# Patient Record
Sex: Male | Born: 1935 | Race: White | Hispanic: No | Marital: Married | Smoking: Current every day smoker
Health system: Southern US, Community
[De-identification: ages and names within clinical notes are randomized; demographics above are authoritative.]

## PROBLEM LIST (undated history)

## (undated) DIAGNOSIS — M1990 Unspecified osteoarthritis, unspecified site: Secondary | ICD-10-CM

## (undated) DIAGNOSIS — I714 Abdominal aortic aneurysm, without rupture, unspecified: Secondary | ICD-10-CM

## (undated) DIAGNOSIS — J449 Chronic obstructive pulmonary disease, unspecified: Secondary | ICD-10-CM

## (undated) DIAGNOSIS — I495 Sick sinus syndrome: Secondary | ICD-10-CM

## (undated) DIAGNOSIS — Z8719 Personal history of other diseases of the digestive system: Secondary | ICD-10-CM

## (undated) DIAGNOSIS — D649 Anemia, unspecified: Secondary | ICD-10-CM

## (undated) DIAGNOSIS — F32A Depression, unspecified: Secondary | ICD-10-CM

## (undated) DIAGNOSIS — I4891 Unspecified atrial fibrillation: Secondary | ICD-10-CM

## (undated) DIAGNOSIS — J310 Chronic rhinitis: Secondary | ICD-10-CM

## (undated) DIAGNOSIS — I2510 Atherosclerotic heart disease of native coronary artery without angina pectoris: Secondary | ICD-10-CM

## (undated) DIAGNOSIS — I10 Essential (primary) hypertension: Secondary | ICD-10-CM

## (undated) DIAGNOSIS — N189 Chronic kidney disease, unspecified: Secondary | ICD-10-CM

## (undated) DIAGNOSIS — I255 Ischemic cardiomyopathy: Secondary | ICD-10-CM

## (undated) DIAGNOSIS — I219 Acute myocardial infarction, unspecified: Secondary | ICD-10-CM

## (undated) DIAGNOSIS — J4489 Other specified chronic obstructive pulmonary disease: Secondary | ICD-10-CM

## (undated) DIAGNOSIS — F329 Major depressive disorder, single episode, unspecified: Secondary | ICD-10-CM

## (undated) DIAGNOSIS — Z7901 Long term (current) use of anticoagulants: Secondary | ICD-10-CM

## (undated) DIAGNOSIS — J189 Pneumonia, unspecified organism: Secondary | ICD-10-CM

## (undated) HISTORY — DX: Chronic rhinitis: J31.0

## (undated) HISTORY — PX: PACEMAKER INSERTION: SHX728

## (undated) HISTORY — PX: TONSILLECTOMY: SUR1361

## (undated) HISTORY — DX: Atherosclerotic heart disease of native coronary artery without angina pectoris: I25.10

## (undated) HISTORY — PX: COCCYX REMOVAL: SHX600

## (undated) HISTORY — DX: Unspecified atrial fibrillation: I48.91

## (undated) HISTORY — DX: Unspecified osteoarthritis, unspecified site: M19.90

## (undated) HISTORY — DX: Other specified chronic obstructive pulmonary disease: J44.89

## (undated) HISTORY — DX: Chronic obstructive pulmonary disease, unspecified: J44.9

---

## 1998-06-15 ENCOUNTER — Observation Stay (HOSPITAL_COMMUNITY): Admission: EM | Admit: 1998-06-15 | Discharge: 1998-06-16 | Payer: Self-pay | Admitting: *Deleted

## 1999-02-21 ENCOUNTER — Inpatient Hospital Stay (HOSPITAL_COMMUNITY): Admission: EM | Admit: 1999-02-21 | Discharge: 1999-02-26 | Payer: Self-pay | Admitting: Emergency Medicine

## 1999-02-21 ENCOUNTER — Encounter: Payer: Self-pay | Admitting: Emergency Medicine

## 1999-02-24 ENCOUNTER — Encounter: Payer: Self-pay | Admitting: Cardiology

## 1999-03-12 ENCOUNTER — Ambulatory Visit (HOSPITAL_COMMUNITY): Admission: RE | Admit: 1999-03-12 | Discharge: 1999-03-12 | Payer: Self-pay | Admitting: Cardiovascular Disease

## 1999-11-18 ENCOUNTER — Ambulatory Visit (HOSPITAL_COMMUNITY): Admission: RE | Admit: 1999-11-18 | Discharge: 1999-11-18 | Payer: Self-pay | Admitting: Cardiovascular Disease

## 1999-11-18 ENCOUNTER — Encounter: Payer: Self-pay | Admitting: Cardiovascular Disease

## 2002-06-05 ENCOUNTER — Encounter: Admission: RE | Admit: 2002-06-05 | Discharge: 2002-06-05 | Payer: Self-pay | Admitting: Orthopaedic Surgery

## 2002-06-05 ENCOUNTER — Encounter: Payer: Self-pay | Admitting: Orthopaedic Surgery

## 2005-01-20 ENCOUNTER — Ambulatory Visit: Payer: Self-pay | Admitting: Internal Medicine

## 2005-07-19 ENCOUNTER — Ambulatory Visit: Payer: Self-pay | Admitting: Internal Medicine

## 2005-09-09 ENCOUNTER — Inpatient Hospital Stay (HOSPITAL_COMMUNITY): Admission: EM | Admit: 2005-09-09 | Discharge: 2005-09-22 | Payer: Self-pay | Admitting: Emergency Medicine

## 2005-10-18 ENCOUNTER — Ambulatory Visit (HOSPITAL_COMMUNITY): Admission: RE | Admit: 2005-10-18 | Discharge: 2005-10-18 | Payer: Self-pay | Admitting: Cardiovascular Disease

## 2006-02-06 ENCOUNTER — Ambulatory Visit: Payer: Self-pay | Admitting: Internal Medicine

## 2006-08-07 ENCOUNTER — Ambulatory Visit: Payer: Self-pay | Admitting: Internal Medicine

## 2007-02-05 ENCOUNTER — Ambulatory Visit: Payer: Self-pay | Admitting: Internal Medicine

## 2007-08-22 ENCOUNTER — Encounter: Payer: Self-pay | Admitting: Internal Medicine

## 2007-08-22 DIAGNOSIS — J310 Chronic rhinitis: Secondary | ICD-10-CM

## 2007-08-22 DIAGNOSIS — Z9861 Coronary angioplasty status: Secondary | ICD-10-CM

## 2007-08-22 DIAGNOSIS — M1990 Unspecified osteoarthritis, unspecified site: Secondary | ICD-10-CM | POA: Insufficient documentation

## 2007-08-22 DIAGNOSIS — I2510 Atherosclerotic heart disease of native coronary artery without angina pectoris: Secondary | ICD-10-CM | POA: Insufficient documentation

## 2007-08-22 DIAGNOSIS — J449 Chronic obstructive pulmonary disease, unspecified: Secondary | ICD-10-CM | POA: Insufficient documentation

## 2007-10-02 ENCOUNTER — Ambulatory Visit: Payer: Self-pay | Admitting: Internal Medicine

## 2008-01-18 ENCOUNTER — Encounter: Payer: Self-pay | Admitting: Internal Medicine

## 2008-03-31 ENCOUNTER — Ambulatory Visit: Payer: Self-pay | Admitting: Internal Medicine

## 2008-07-22 ENCOUNTER — Encounter: Payer: Self-pay | Admitting: Internal Medicine

## 2008-07-23 ENCOUNTER — Telehealth: Payer: Self-pay | Admitting: Internal Medicine

## 2008-07-28 ENCOUNTER — Ambulatory Visit: Payer: Self-pay | Admitting: Internal Medicine

## 2008-08-03 DIAGNOSIS — I4891 Unspecified atrial fibrillation: Secondary | ICD-10-CM

## 2008-09-12 ENCOUNTER — Ambulatory Visit (HOSPITAL_COMMUNITY): Admission: RE | Admit: 2008-09-12 | Discharge: 2008-09-12 | Payer: Self-pay | Admitting: Urology

## 2008-11-17 ENCOUNTER — Encounter: Payer: Self-pay | Admitting: Internal Medicine

## 2009-01-15 ENCOUNTER — Ambulatory Visit: Payer: Self-pay | Admitting: Internal Medicine

## 2009-07-16 ENCOUNTER — Ambulatory Visit: Payer: Self-pay | Admitting: Internal Medicine

## 2009-09-16 ENCOUNTER — Ambulatory Visit (HOSPITAL_COMMUNITY): Admission: RE | Admit: 2009-09-16 | Discharge: 2009-09-16 | Payer: Self-pay | Admitting: Cardiology

## 2009-09-22 ENCOUNTER — Encounter: Payer: Self-pay | Admitting: Internal Medicine

## 2009-10-01 ENCOUNTER — Encounter: Payer: Self-pay | Admitting: Internal Medicine

## 2010-01-12 ENCOUNTER — Encounter: Payer: Self-pay | Admitting: Internal Medicine

## 2010-01-19 ENCOUNTER — Ambulatory Visit: Payer: Self-pay | Admitting: Internal Medicine

## 2010-05-04 ENCOUNTER — Encounter: Payer: Self-pay | Admitting: Internal Medicine

## 2010-09-06 ENCOUNTER — Telehealth: Payer: Self-pay | Admitting: Internal Medicine

## 2010-09-29 ENCOUNTER — Encounter: Payer: Self-pay | Admitting: Internal Medicine

## 2010-10-11 ENCOUNTER — Ambulatory Visit
Admission: RE | Admit: 2010-10-11 | Discharge: 2010-10-11 | Payer: Self-pay | Source: Home / Self Care | Attending: Internal Medicine | Admitting: Internal Medicine

## 2010-10-13 DIAGNOSIS — F17200 Nicotine dependence, unspecified, uncomplicated: Secondary | ICD-10-CM | POA: Insufficient documentation

## 2010-10-19 ENCOUNTER — Telehealth (INDEPENDENT_AMBULATORY_CARE_PROVIDER_SITE_OTHER): Payer: Self-pay | Admitting: *Deleted

## 2010-11-04 NOTE — Letter (Signed)
Summary: [facility]  [facility]   Imported By: Lester Avon 01/19/2010 09:42:49  _____________________________________________________________________  External Attachment:    Type:   Image     Comment:   External Document

## 2010-11-04 NOTE — Letter (Signed)
Summary: [facility]  [facility]   Imported By: Lennie Odor 10/20/2010 14:54:43  _____________________________________________________________________  External Attachment:    Type:   Image     Comment:   External Document

## 2010-11-04 NOTE — Assessment & Plan Note (Signed)
Summary: rov 6 months ///kp   Primary Provider/Referring Provider:  Katrinka Blazing in [place]  CC:  follow up, c/o head and neck pain, 3/10, coughs up loose brown mucous, and x3 days.  History of Present Illness: 07/28/08- had bronchitis. Antibiotic affected his coumadin. Now has a new cold, minor sniffle, no fever or purulent. Finished prednisone taper yesterday. Got flu vax. Needs a minute to get his breath when he lies down, but sleeps one pillow. Denies edema, chest pain.  01/15/09- COPD, rhinitis, CAD Concerned about cost of Foradil. Has a nebulizer but says it burns his mouth. Foradil does help. Shows me verrucous lesion on right auricle he's been removing with "rocket fuel".  Says he had productive cough through winter, now resolved.  denies chest pain, fever, blood, nodes, edema  11-Aug-2009- COPD, rhinitis, CAD Feels very gradual worsening dyspnea with exertion over time. Dusty work out on tractor this week has increased his wheeze.C/O rhinorhea.  January 19, 2010- COPD, rhinitis, CAD Blames pollen for head cold over the last week. Low grade fever. Productive cough brown. Denies sinus pain. Tussive sore chest .Took otc last night, but can't remember the name.Marland Kitchen He saw Dr Alanda Amass recently and was given depo inj there for dyspnea with COPD.   Current Medications (verified): 1)  Zocor 20 Mg  Tabs (Simvastatin) .... Once Daily 2)  Toprol Xl 50 Mg  Tb24 (Metoprolol Succinate) .... Take 1 1/2 Once Daily 3)  Spiriva Handihaler 18 Mcg  Caps (Tiotropium Bromide Monohydrate) .... Inhale Contents of 1 Capsule Once A Day 4)  Coumadin 1 Mg Tabs (Warfarin Sodium) .... Take As Directed 5)  Zantac 150 Mg Tabs (Ranitidine Hcl) .... Take As Directed As Needed 6)  Furosemide 20 Mg Tabs (Furosemide) .... Take As Directed 7)  Adult Aspirin Low Strength 81 Mg Tbdp (Aspirin) .... Take 1 By Mouth Once Daily 8)  Foradil Aerolizer 12 Mcg  Caps (Formoterol Fumarate) .... Use One Capsule Twice Daily  As Directed. 9)  Proair Hfa 108 (90 Base) Mcg/act  Aers (Albuterol Sulfate) .... 2 Puffs, Four Times A Day Prn 10)  Crestor 5 Mg Tabs (Rosuvastatin Calcium) .... Tale 1 By Mouth Once Daily 11)  Allopurinol 300 Mg Tabs (Allopurinol) .... Take 1 By Mouth Once Daily 12)  Qc Chlor-Pheniramine 4 Mg Tabs (Chlorpheniramine Maleate) .... Take As Directed As Needed 13)  Digoxin 0.125 Mg Tabs (Digoxin) .... Take 1 By Mouth Once Daily 14)  Dulera 100-5 Mcg/act Aero (Mometasone Furo-Formoterol Fum) .... 2 Puffs and Rinse Twice Daily 15)  Ipratropium Bromide 0.06 % Soln (Ipratropium Bromide) .Marland Kitchen.. 1-2 Puffs Each Nostril Up To Three Times A Day As Needed Runny Nose  Allergies: 1)  ! Sulfa  Comments:  Nurse/Medical Assistant: The patient's medications and allergies were reviewed with the patient and were updated in the Medication and Allergy Lists.  Past History:  Past Medical History: Last updated: 07/28/2008 OSTEOARTHRITIS (ICD-715.90) CORONARY ARTERY DISEASE (ICD-414.00) RHINITIS (ICD-472.0) CHRONIC OBSTRUCTIVE PULMONARY DISEASE (ICD-496) Atrial Fibrillation- chronic-coumadin, VVIR pacemaker-Dr.Weintraub Gout- Dr. 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  The patient denies anorexia, fever, weight  loss, weight gain, vision loss, decreased hearing, hoarseness, chest pain, syncope, peripheral edema, hemoptysis, abdominal pain, and severe indigestion/heartburn.    Vital Signs:  Patient profile:   77 year old male Height:      67 inches Weight:      165.38 pounds BMI:     26.00 O2  Sat:      91 % on Room air Temp:     99.3 degrees F oral Pulse rate:   103 / minute BP sitting:   124 / 86  (left arm) Cuff size:   regular  O2 Sat at Rest %:  91 O2 Flow:  Room air CC: follow up, c/o head and neck pain, 3/10, coughs up loose brown mucous, x3 days Is Patient Diabetic? No Pain Assessment Patient in pain? yes      Intensity: 3 Onset of pain  back of head and neck Comments meds updated   Physical Exam  Additional Exam:  General: A/Ox3; pleasant and cooperative, NAD, unlabored SKIN: verrucous lesion riight ear NODES: no lymphadenopathy HEENT: [place]/AT, EOM- WNL, Conjuctivae- clear, PERRLA, TM-WNL, Nose- clear, Throat- clear and wnl NECK: Supple w/ fair ROM, JVD- none, normal carotid impulses w/o bruits Thyroid- CHEST: diminished, slow congested cough  HEART: nearly regular, faint, no m/g/r heard ABDOMEN HYQ:MVHQ, nl pulses. NEURO: Grossly intact to observation,       Impression & Recommendations:  Problem # 1:  CHRONIC OBSTRUCTIVE PULMONARY DISEASE (ICD-496) Acute bronchitis exacerbation. He has neb at home. We will give depo and Z pak. Dr Alanda Amass gave depomedrol a week ago.If he doesn't begin to dlear we will look harder.  Medications Added to Medication List This Visit: 1)  Zithromax Z-pak 250 Mg Tabs (Azithromycin) .... 2 today then one daily  Other Orders: Est. Patient Level III (46962) Prescription Created Electronically [phone]) Admin of Therapeutic Inj  intramuscular or subcutaneous (13244)  Patient Instructions: 1)  Please schedule a follow-up appointment in 6 months. 2)  depo 80- 3)  script for antibiotic sent to your drug store Prescriptions: ZITHROMAX Z-PAK 250 MG TABS (AZITHROMYCIN) 2 today then one daily  #1 pak x 0   Entered and Authorized by:   Waymon Budge MD   Signed by:   Waymon Budge MD on 01/19/2010   Method used:   Electronically to        [facility]* (retail)       [address]        [place], [place]  [address]       Ph: [phone]       Fax: [phone]   RxID:   [id number]    Immunization History:  Influenza Immunization History:    Influenza:  historical (07/09/2009)  Pneumovax Immunization History:    Pneumovax:  historical (07/10/2009)      Medication Administration  Injection # 1:    Medication: Depo- Medrol 80mg     Diagnosis: CHRONIC OBSTRUCTIVE PULMONARY DISEASE (ICD-496)    Route: IM    Site: LUOQ gluteus    Exp Date: 08/2012    Lot #: [id number]    Mfr: Pharmacia    Patient tolerated injection without complications    Given by: Randell Loop CMA (January 19, 2010 4:02 PM)  Orders Added: 1)  Est. Patient Level III [63016] 2)  Prescription Created Electronically [[id number]] 3)  Admin of Therapeutic Inj  intramuscular or subcutaneous [01093]

## 2010-11-04 NOTE — Assessment & Plan Note (Signed)
Summary: rov/jd   Primary Provider/Referring Provider:  Katrinka Blazing in [place]  CC:  follow up on COPD. pt states his breathing has been "okay". Pt c/o cough w/ clear phlem, chest tightness, and wheezing and some weakness.  History of Present Illness: 01/15/09- COPD, rhinitis, CAD Concerned about cost of Foradil. Has a nebulizer but says it burns his mouth. Foradil does help. Shows me verrucous lesion on right auricle he's been removing with "rocket fuel".  Says he had productive cough through winter, now resolved.  denies chest pain, fever, blood, nodes, edema  July 16, 2009- COPD, rhinitis, CAD Feels very gradual worsening dyspnea with exertion over time. Dusty work out on tractor this week has increased his wheeze.C/O rhinorhea.  January 19, 2010- COPD, rhinitis, CAD Blames pollen for head cold over the last week. Low grade fever. Productive cough brown. Denies sinus pain. Tussive sore chest .Took otc last night, but can't remember the name.Marland Kitchen He saw Dr Alanda Amass recently and was given depo inj there for dyspnea with COPD.  October 11, 2010- COPD, rhinitis, CAD Nurse-CC: follow up on COPD. pt states his breathing has been "okay". Pt c/o cough w/ clear phlegm, chest tightness, wheezing and some weakness. Says pollen bothered badly in early Fall. then some chest tightness after flu shot. Never completely cleared, still with some productive cough white to gray sputum. No  blood, chest pain, nodes or fever. Denies obvious infection. Still smokes 3 cigs/ day- discussed. Discussed his latest cardiology visit with Dr Alanda Amass. He didn't finish 10 day abx script of ?biaxin- from his PCP.    Preventive Screening-Counseling & Management  Alcohol-Tobacco     Smoking Status: current     Smoking Cessation Counseling: yes     Packs/Day: <0.25     Year Started: 77 years old     Tobacco Counseling: to quit use of tobacco products  Current Medications (verified): 1)  Toprol Xl 50 Mg  Tb24  (Metoprolol Succinate) .... Take 1 1/2 Once Daily 2)  Coumadin 2 Mg Tabs (Warfarin Sodium) .... As Directed 3)  Zantac 150 Mg Tabs (Ranitidine Hcl) .... Take As Directed As Needed 4)  Furosemide 20 Mg Tabs (Furosemide) .... Take As Directed 5)  Adult Aspirin Low Strength 81 Mg Tbdp (Aspirin) .... Take 1 By Mouth Once Daily 6)  Foradil Aerolizer 12 Mcg  Caps (Formoterol Fumarate) .... Use One Capsule Twice Daily As Directed. 7)  Proair Hfa 108 (90 Base) Mcg/act  Aers (Albuterol Sulfate) .... 2 Puffs, Four Times A Day Prn 8)  Crestor 10 Mg Tabs (Rosuvastatin Calcium) .... 1/2 Once Daily 9)  Qc Chlor-Pheniramine 4 Mg Tabs (Chlorpheniramine Maleate) .... Take As Directed As Needed 10)  Digoxin 0.125 Mg Tabs (Digoxin) .... Take 1 By Mouth Once Daily 11)  Ipratropium Bromide 0.06 % Soln (Ipratropium Bromide) .Marland Kitchen.. 1-2 Puffs Each Nostril Up To Three Times A Day As Needed Runny Nose 12)  Midodrine Hcl 2.5 Mg Tabs (Midodrine Hcl) .... Once Daily 13)  Colchicine-Probenecid 0.5-500 Mg Tabs (Colchicine-Probenecid) .... 60 Mg 1 Two Times A Day 14)  Metoprolol Tartrate 25 Mg Tabs (Metoprolol Tartrate) .Marland Kitchen.. 1 1/2 Once Daily 15)  Flomax 0.4 Mg Caps (Tamsulosin Hcl) .Marland Kitchen.. 1 Two Times A Day  Allergies (verified): 1)  ! Sulfa  Past History:  Past Medical History: Last updated: 07/28/2008 OSTEOARTHRITIS (ICD-715.90) CORONARY ARTERY DISEASE (ICD-414.00) RHINITIS (ICD-472.0) CHRONIC OBSTRUCTIVE PULMONARY DISEASE (ICD-496) Atrial Fibrillation- chronic-coumadin, VVIR pacemaker-Dr.Weintraub Gout- Dr. 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 3 cigs a day.   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 3 cigs a day.  Packs/Day:  <0.25  Review of Systems      See HPI       The patient complains of shortness of breath with activity, productive cough, and non-productive cough.  The patient denies shortness of breath at rest, coughing up blood, chest pain, irregular heartbeats, acid heartburn, indigestion, loss of appetite, weight change, abdominal pain, difficulty swallowing, sore throat, tooth/dental problems, headaches, nasal congestion/difficulty breathing through nose, and sneezing.    Vital Signs:  Patient profile:   77 year old male Height:      67 inches Weight:      158.50 pounds BMI:     24.91 O2 Sat:      92 % on Room air Pulse rate:   75 / minute BP sitting:   80 / 60  (right arm) Cuff size:   regular  Vitals Entered By: Carver Fila (October 11, 2010 2:33 PM)  O2 Flow:  Room air CC: follow up on COPD. pt states his breathing has been "okay". Pt c/o cough w/ clear phlem, chest tightness, wheezing and some weakness Comments meds and allergies updated Phone number updated Carver Fila  October 11, 2010 2:33 PM    Physical Exam  Additional Exam:  General: A/Ox3; pleasant and cooperative, NAD, unlabored SKIN: verrucous lesion riight ear NODES: no lymphadenopathy HEENT: [place]/AT, EOM- WNL, Conjuctivae- clear, PERRLA, TM-WNL, Nose- clear, Throat- clear and wnl NECK: Supple w/ fair ROM, JVD- none, normal carotid impulses w/o bruits Thyroid- CHEST: diminished, slow congested cough ,  bilateral crackles HEART: nearly regular, faint, no m/g/r heard ABDOMEN not obese ZOX:WRUE, nl pulses. NEURO: Grossly intact to observation,       Impression & Recommendations:  Problem # 1:  CHRONIC OBSTRUCTIVE PULMONARY DISEASE (ICD-496) Chronic bronchits with ongoing light smoking. He is resistant to adding more meds. We will advise CXR and mucinex. Smoking cessation.   Problem # 2:  TOBACCO USER  (ICD-305.1) He pushed for my agreement that probably a couple of cigarettes daily would have little medical significance. I pushed for him to quit completely with discussion of habit vs addiction.   Medications Added to Medication List This Visit: 1)  Coumadin 2 Mg Tabs (Warfarin sodium) .... As directed 2)  Crestor 10 Mg Tabs (Rosuvastatin calcium) .... 1/2 once daily 3)  Midodrine Hcl 2.5 Mg Tabs (Midodrine hcl) .... Once daily 4)  Colchicine-probenecid 0.5-500 Mg Tabs (Colchicine-probenecid) .... 60 mg 1 two times a day 5)  Metoprolol Tartrate 25 Mg Tabs (Metoprolol tartrate) .Marland Kitchen.. 1 1/2 once daily 6)  Flomax 0.4 Mg Caps (Tamsulosin hcl) .Marland Kitchen.. 1 two times a day  Other Orders: Est. Patient Level III (45409) Admin of Therapeutic Inj  intramuscular or subcutaneous (81191) Depo- Medrol 80mg  (J1040) Nebulizer Tx (47829) T-2 View CXR (71020TC)  Patient Instructions: 1)  Please schedule a follow-up appointment in 6 months. 2)  A chest x-ray has been recommended.  Your imaging study may require preauthorization.  3)  Neb xop 0.63 4)  depo 80  5)  Suggest mucinex otc as a mucus thinner if needed   Immunization History:  Influenza  Immunization History:    Influenza:  historical (07/03/2010)     Medication Administration  Injection # 1:    Medication: Depo- Medrol 80mg     Diagnosis: CHRONIC OBSTRUCTIVE PULMONARY DISEASE (ICD-496)    Route: IM    Site: LUOQ gluteus    Exp Date: 01/2013    Lot #: [id number]    Mfr: Pharmacia    Patient tolerated injection without complications    Given by: Carver Fila (October 11, 2010 4:11 PM)  Orders Added: 1)  Est. Patient Level III [60454] 2)  Admin of Therapeutic Inj  intramuscular or subcutaneous [96372] 3)  Depo- Medrol 80mg  [J1040] 4)  Nebulizer Tx [94640] 5)  T-2 View CXR [71020TC]

## 2010-11-04 NOTE — Letter (Signed)
Summary: [facility]   Imported By: Sherian Rein 05/17/2010 10:15:04  _____________________________________________________________________  External Attachment:    Type:   Image     Comment:   External Document

## 2010-11-04 NOTE — Progress Notes (Signed)
Summary: xray results  Phone Note Call from Patient Call back at Home Phone [phone]   Caller: Patient Call For: young Reason for Call: Lab or Test Results Summary of Call: Requests xray results. Initial call taken by: Darletta Moll,  October 19, 2010 11:22 AM  Follow-up for Phone Call        Spoke with pt and notified of results per CDY append to cxr.  Pt verbalized understanding. Follow-up by: Vernie Murders,  October 19, 2010 11:35 AM

## 2010-11-04 NOTE — Progress Notes (Signed)
Summary: nos appt  Phone Note Call from Patient   Caller: [email or web address]  Call For: young Summary of Call: Rsc nos from 12/2 to 10/11/2010. Initial call taken by: Darletta Moll,  September 06, 2010 12:01 PM

## 2011-01-04 LAB — URINALYSIS, ROUTINE W REFLEX MICROSCOPIC
Bilirubin Urine: NEGATIVE
Nitrite: NEGATIVE
Specific Gravity, Urine: 1.019 (ref 1.005–1.030)
Urobilinogen, UA: 1 mg/dL (ref 0.0–1.0)
pH: 6.5 (ref 5.0–8.0)

## 2011-01-04 LAB — URINE CULTURE

## 2011-01-04 LAB — URINE MICROSCOPIC-ADD ON

## 2011-02-15 NOTE — Op Note (Signed)
NAME:  Willie [name], Willie [name] NO.:  [id number]   MEDICAL RECORD NO.:  [id number]          PATIENT TYPE:  AMB   LOCATION:  DAY                          FACILITY:  [facility]   PHYSICIAN:  Maretta Bees. Vonita Moss, M.D.DATE OF BIRTH:  09-20-1928   DATE OF PROCEDURE:  09/12/2008  DATE OF DISCHARGE:                               OPERATIVE REPORT   PREOPERATIVE DIAGNOSIS:  Distal left ureteral stones.   POSTOPERATIVE DIAGNOSIS:  Distal left ureteral stones.   PROCEDURE:  Cystoscopy, left ureteroscopy and laser fragmentation and  basketing of stones.   SURGEON:  Larey Dresser, MD   ANESTHESIA:  General.   INDICATIONS:  A 77 year old gentleman with several weeks of pelvic  discomfort and frequency due to a 6-7 mm stone and one other tiny stone  in the distal left ureter.  He felt like he needed relief and therefore  he was brought to the OR today for that reason.   PROCEDURE IN DETAIL:  The patient was brought to the operating room and  placed in lithotomy position.  External genitalia were prepped and  draped in the usual fashion.  He was cystoscoped.  The anterior  prostatic urethra was unremarkable.  There was partial prostatic  obstruction.  The bladder had no stones, tumors or inflammatory lesions.  I then passed a retractable core guidewire under fluoroscopic control  and bypassed the stone in distal left ureter.  I then inserted the inner  core of a ureteral access sheath to dilate the intramural ureter which  was done easily.  I then used the short 6 French ureteroscope and found  the brownish irregular stone with two other tiny whitish stones and  using Holmium laser to fragment this large stone in several pieces.  I  then was easily able to basket out all the remaining stone fragments  which were later retrieved from the bladder cystoscopically.  I then ran  the scope up almost to the kidney and saw no further stones.  I then  injected contrast through the ureteroscope  and the ureter was intact  with no extravasation and prompt drainage and the ureter was visualized  and noted to be intact with no significant trauma whatsoever.  Therefore, I decided it was not necessary to place a double-J catheter  at this time.   I should add that his preoperative chest x-ray raised a question of a  left chest mass and I gave a copy of the chest x-ray report to his son  and wrote down written instructions for them to take this to their  medical doctor for further evaluation.      Maretta Bees. Vonita Moss, M.D.  Electronically Signed     LJP/MEDQ  D:  09/12/2008  T:  09/13/2008  Job:  [id number]   cc:   Gerlene Burdock A. Alanda Amass, M.D.  Fax: [phone]

## 2011-02-18 NOTE — Discharge Summary (Signed)
NAMEDORTHY, HUSTEAD NO.:  [id number]   MEDICAL RECORD NO.:  [id number]          PATIENT TYPE:  INP   LOCATION:  3742                         FACILITY:  [facility]   PHYSICIAN:  Nanetta Batty, M.D.   DATE OF BIRTH:  August 24, 1928   DATE OF ADMISSION:  09/09/2005  DATE OF DISCHARGE:                                 DISCHARGE SUMMARY   ADDENDUM:  At discharge, we held Mr. Affeldt Lasix and added Zocor 40 mg a  day.  He will need followup lipids as an outpatient.      Abelino Derrick, P.A.      Nanetta Batty, M.D.  Electronically Signed    LKK/MEDQ  D:  09/22/2005  T:  09/24/2005  Job:  [id number]

## 2011-02-18 NOTE — Discharge Summary (Signed)
[name], [name] NO.:  [id number]   MEDICAL RECORD NO.:  [id number]          PATIENT TYPE:  INP   LOCATION:  3742                         FACILITY:  [facility]   PHYSICIAN:  Willie [name], M.D.DATE OF BIRTH:  [birth year]   DATE OF ADMISSION:  09/09/2005  DATE OF DISCHARGE:                                 DISCHARGE SUMMARY   DISCHARGE DIAGNOSIS:  1.  MI this admission, plan is for medical therapy.  2.  Chronic atrial fibrillation.  3.  PT/PTT.  4.  Coumadin therapy.  5.  COPD.   HOSPITAL COURSE:  The patient is a 77 year old male followed by Dr.  Alanda [name] with a history of coronary disease. He had a pacemaker implanted  in 1998.  He has chronic AF. He had an L8 non DES stent in May 2000. He was  admitted 09/10/2003 with unstable angina. His INR was 1.7. Coumadin was  held. He was put on IV heparin. His troponins came back positive with a CK  of 1949 and 254 MB's.  He was also put on Integrilin on admission. He was  set up for diagnostic catheterization which was done 09/12/2005. This  revealed 80% proximal RCA and 95% mid RCA, it was a small vessel. Left main  had 20% narrowing and LAD had a patent stent site. The circumflex was  patent. The OM1 had a 50% narrowing, OM2 60% and OM3 60%. EF was 40-45% with  some inferior apical hypokinesis. The plan was for medical therapy.  Integrilin was discontinued. He was transferred to telemetry and Coumadin  was resumed. He did have some hypotension and we had to hold off on his ACE  inhibitor. Dr. Alanda [name] would like him to be considered for outpatient  E[facility]P.  He will discuss this further with him as an outpatient. His INR on  the 21st is 2.1. We feel he is stable for discharge.   DISCHARGE MEDICATIONS:  1.  Colchicine 0.6 milligrams a day.  2.  Zantac 150 once a day.  3.  Lanoxin 0.125 milligrams a day.  4.  Lexapro 10 milligrams a day.  5.  Vasotec 2.5 milligrams a day.  6.  M-Dur 15 milligrams a day.  7.   Aspirin 81 milligrams a day.  8.  Toprol XL 25 milligrams a day.   LABS:  Sodium 143, potassium 3.9, BUN 16, creatinine 1.3, white count 6.5,  hemoglobin 13.6, hematocrit 39, platelets 205.  Liver functions show an AST  of 80 on admission with an ALT of 26.  Urinalysis is unremarkable. TSH is  3.86.  CHEST X-RAY:  Shows no active disease with artifact along the left  lung peripheral chest wall and COPD.  His EKG is paced with afib underlying.   DISPOSITION:  The patient is discharged in stable condition and will follow-  up with Dr. Alanda [name] in a couple of weeks.  He will have a protime checked  next week. He will resume his home dose of Coumadin which is 2 milligrams a  day with 4 milligrams on Friday.      Willie [name],  P.A.      Willie [name], M.D.  Electronically Signed    LKK/MEDQ  D:  09/22/2005  T:  09/24/2005  Job:  [id number]   cc:   Willie [name], M.D.  Fax: [phone]

## 2011-02-18 NOTE — Assessment & Plan Note (Signed)
[facility]                             PULMONARY OFFICE NOTE   NAME:BENNETTOrestes, Geiman                      MRN:          [id number]  DATE:02/05/2007                            DOB:          1928-09-30    PROBLEMS:  1. Chronic obstructive pulmonary disease.  2. Rhinitis.  3. Coronary disease/infarction/pacemaker.  4. Osteoarthritis.   HISTORY:  He gets his primary care in [place], [place].  He says  he dislikes this pollen and blames pollen for occasionally needing to  use his Foradil three times a day.  I discussed the drug, asking him to  try to keep it down to b.i.d. to avoid overstimulation, and we discussed  alternatives.  Scant gray sputum, nothing bloody, no chest pain, no  acute event.  Medication list is unchanged from our last review,  significant for Foradil, Spiriva and rescue albuterol with drug  intolerance to SULFA.   OBJECTIVE:  Weight 182 pounds, BP 108/60, pulse 76, regular to  palpation, room air saturation 95%.  There is mild bilateral expiratory  wheeze, unlabored.  No accessory muscle use.  No adenopathy.  No neck  vein distention or stridor and no edema.   IMPRESSION:  Asthma/chronic obstructive pulmonary disease with  exacerbation.   PLAN:  Chest x-ray was done today as he left and returns in time for  dictation, reporting mild cardiac enlargement, no heart failure,  interstitial changes consistent with COPD.  There is a left chest wall  pacer device.  The film was compared with December 2006.   PLAN:  1. I have asked him to try changing Foradil to Symbicort two puffs      b.i.d. with mouth rinse.  He can use albuterol as a rescue inhaler      as instructed, but I have asked him not to add long-acting Foradil      as a rescue inhaler to avoid overstimulation.  2. Schedule return in 6 months, earlier p.r.n.     Clinton D. Maple Hudson, MD, Tonny Bollman, FACP  Electronically Signed    CDY/MedQ  DD: 02/05/2007  DT:  02/06/2007  Job #: [id number]   cc:   Gerlene Burdock A. Alanda Amass, M.D.

## 2011-02-18 NOTE — Cardiovascular Report (Signed)
[name], WIK NO.:  [id number]   MEDICAL RECORD NO.:  [id number]          PATIENT TYPE:  INP   LOCATION:  2910                         FACILITY:  [facility]   PHYSICIAN:  Willie Priestly, MD  DATE OF BIRTH:  1928/09/07   DATE OF PROCEDURE:  09/12/2005  DATE OF DISCHARGE:                              CARDIAC CATHETERIZATION   PROCEDURES:  1.  Left heart catheterization.  2.  Coronary angiography.  3.  Left ventriculogram.  4.  Ascending arteriography.  5.  Abdominal aortogram.   ATTENDING:  Darlin [name], M.D.   COMPLICATIONS:  None.   INDICATIONS:  Willie [name] is a 77 year old male, patient of Dr. Alanda [name],  Dr. Maple [name], Dr. Earnest [name], with a history of CAD, status post stenting of his  LAD in May 2000, history of permanent pacer implant with atrial  fibrillation, COPD, who was admitted on September 09, 2005, with increasing  shortness of breath and chest pain.  He subsequently ruled in for a  myocardial infarction.  He was subsequently put on IV heparin and  Integrilin.  He is now referred for cardiac catheterization to further  evaluate his CAD.   DESCRIPTION:  After giving informed consent, the patient was brought to the  cardiac cath lab.  Right and left groins were shaved, prepped and draped in  the usual sterile fashion.  ECG monitor was established.  Using modified  Seldinger technique, a #6-French arterial sheath inserted into the right  femoral artery.  A 6-French diagnostic catheter was then used to perform  diagnostic angiography.   It should be noted that upon gaining access to the right femoral artery, we  were able to pass the guidewire into the mid portion of the iliac, however,  there appeared to be some stenosis in the right iliac.  The sheath was  inserted and a right __________  diagnostic catheter was then used to steer  the 038 J wire through the iliac into the distal aorta and exchange wires  were used beyond this point.   1.   The right coronary artery is a small nondominant vessel with an 80%      proximal and 95 to 99% mid vessel stenosis with TIMI 2 flow into the      distal small RCA.  2.  The left main is a large vessel with mild 20 to 30% proximal narrowing.  3.  The LAD is a large vessel which coursed over the apex and gives rise to      two diagonal branches.  The LAD is noted to have some mild calcification      at the proximal segment with a stent in the mid portion.  There is a      mild 30% distal in-stent restenosis, however, the remainder of the LAD      appears to be widely patent.  4.  The first diagonal is a large vessel which bifurcates distally with no      significant disease.  5.  The second diagonal is a small vessel with no significant disease.  6.  There  is a medium size ramus intermedius which bifurcates distally with      a 50% proximal left stenosis.  7.  The left circumflex is a large vessel which is dominant.  It gives rise      to two obtuse marginals as well as PD and posterolateral branch.  The      circumflex is noted to be irregular with mild aneurysmal segments but no      high grade stenosis.  8.  The first OM is a medium sized vessel which bifurcates distally with a      60% proximal lesion.  9.  The second OM is a small vessel with 60% proximal stenosis.  The PD and      posterolateral branch arising from the circumflex is a large vessel with      no significant disease.   LEFT VENTRICULOGRAM:  Reveals a mildly depressed EF at 40-45% with infra-  apical hypokinesis.   ASCENDING AORTOGRAPHY:  Reveals mild to moderately dilated ascending aorta.  There does not appear to be any significant aortic insufficiency.   ABDOMINAL AORTOGRAM:  Reveals widely patent renal arteries.  The abdominal  aorta is tortuous with marked tortuosity of the right iliac.  There is  diffuse aneurysmal disease of the proximal and mid right iliac with a 50 to  60% proximal stenosis in the right  iliac.   HEMODYNAMICS:  1.  Systemic arterial pressure 87/67.  2.  LV systemic pressure 97/7, LVEDP of 10.   CONCLUSIONS:  1.  Significant one-vessel coronary artery disease involving a small non-      dominant right coronary artery.  2.  Mildly depressed left ventricular systolic function.  3.  Mild to moderately dilated aortic root.  4.  Severe aneurysmal dilatation of the iliacs with 50 to 60% stenosis of      the right iliac.      Willie Priestly, MD  Electronically Signed     RHM/MEDQ  D:  09/12/2005  T:  09/13/2005  Job:  [id number]   cc:   Gerlene Burdock A. Willie [name], M.D.  Fax: [phone]   Young, Dr.   Earnest [name], Dr.

## 2011-02-18 NOTE — Assessment & Plan Note (Signed)
[facility]                               PULMONARY OFFICE NOTE   NAME:Willie [name], Willie [name]                      MRN:          [id number]  DATE:08/07/2006                            DOB:          [birth year]    PROBLEM:  .  1. Chronic obstructive pulmonary disease.  2. Rhinitis.  3. Coronary disease/infarction/pacemaker.  4. Osteoarthritis.   HISTORY:  He has felt stable, returning recently from a fishing trip at [employer].  He plans to get his flu vaccine where he can get it free later next  week.  Was not bothered as much fall pollen this year.  He paces himself  with no acute problems.  Complains of watery rhinorrhea, which was not  helped by antihistamines including Clarinex.  Nothing purulent or bloody.  No chest pain or palpitation.   MEDICATIONS:  1. Zocor 20 mg.  2. Toprol XL 50 mg x1/2.  3. Colchicine 0.6 mg.  4. Spiriva.  5. Coumadin.  6. Zantac.  7. Cardizem.  8. Lasix.  9. Zyrtec.  10.Aspirin.  11.Foradil.  1. Albuterol rescue inhaler.   DRUG INTOLERANT OF SULFA.   OBJECTIVE:  Weight 183 pounds.  BP 142/84.  Pulse regular at 82.  Room air  saturation 94%.  He is tanned with no adenopathy or edema.  HEART:  Sounds are regular.  I do not hear any murmur.  Breath sounds are quite diminished, but unlabored.   IMPRESSION:  1. Stable chronic obstructive pulmonary disease.  2. Watery rhinorrhea, nonspecific, but probably with an allergy component.   PLAN:  1. Try ipratropium 0.06% nasal spray once each nostril t.i.d. p.r.n.,      otherwise continue present meds.  2. Walk for endurance as able.  3. Schedule return in 6 months, earlier p.r.n.     Clinton D. Maple Hudson, MD, Tonny Bollman, FACP  Electronically Signed    CDY/MedQ  DD: 08/07/2006  DT: 08/08/2006  Job #: [id number]   cc:   Gerlene Burdock A. Alanda Amass, M.D.  Claudette Laws, M.D.

## 2011-03-10 ENCOUNTER — Other Ambulatory Visit: Payer: Self-pay | Admitting: *Deleted

## 2011-03-10 MED ORDER — FORMOTEROL FUMARATE 12 MCG IN CAPS: 12.0000 ug | ORAL_CAPSULE | Freq: Two times a day (BID) | RESPIRATORY_TRACT | Status: DC

## 2011-04-11 ENCOUNTER — Ambulatory Visit: Payer: Self-pay | Admitting: Internal Medicine

## 2011-05-10 ENCOUNTER — Encounter: Payer: Self-pay | Admitting: Internal Medicine

## 2011-05-10 ENCOUNTER — Ambulatory Visit (INDEPENDENT_AMBULATORY_CARE_PROVIDER_SITE_OTHER): Payer: Medicare Other | Admitting: Internal Medicine

## 2011-05-10 VITALS — BP 84/60 | HR 61 | Ht 68.0 in | Wt 158.6 lb

## 2011-05-10 DIAGNOSIS — F17200 Nicotine dependence, unspecified, uncomplicated: Secondary | ICD-10-CM

## 2011-05-10 DIAGNOSIS — J449 Chronic obstructive pulmonary disease, unspecified: Secondary | ICD-10-CM

## 2011-05-10 NOTE — Progress Notes (Signed)
Subjective:    Patient ID: Willie [name], male    DOB: 1928/03/16, 77 y.o.   MRN: [id number]  HPI 05/10/11- 77 yoM smoker, followed for COPD, complicated by Hx AFib/ pacemaker Last here October 11, 2010 Denies major changes since last here.  Coughs often, clear phlegm. Mowed in heat 4 days ago. Blames that for a "cold" and is taking a Z pak from PCP Dr Benancio Deeds  in [place].  Still smoking 2 cigarettes daily after breakfast, chews during the day. Complains of cost of Foradil- really notices if he misses a dose .  Cites good report from Dr Alanda Amass yesterday.  CXR- 10/11/10- COPD, NAD, unchanged. Pacemaker in place.    Review of Systems Constitutional:   No-   weight loss, night sweats, fevers, chills, fatigue, lassitude. HEENT:   No-  headaches, difficulty swallowing, tooth/dental problems, sore throat,       No-  sneezing, itching, ear ache, nasal congestion, post nasal drip,  CV:  No-   chest pain, orthopnea, PND, swelling in lower extremities, anasarca, dizziness, palpitations Resp: No- acute shortness of breath with exertion or at rest.              No-   productive cough,  mild non-productive cough,  No-  coughing up of blood.              No-   change in color of mucus.  No- wheezing.   Skin: No-   rash or lesions. GI:  No-   heartburn, indigestion, abdominal pain, nausea, vomiting, diarrhea,                 change in bowel habits, loss of appetite GU: No-   dysuria, change in color of urine, no urgency or frequency.  No- flank pain. MS:  No-   joint pain or swelling.  No- decreased range of motion.  No- back pain. Neuro- grossly normal to observation, Or:  Psych:  No- change in mood or affect. No depression or anxiety.  No memory loss.        Objective:   Physical Exam General- Alert, Oriented, Affect-appropriate, Distress- none acute Skin- rash-none, lesions- none, excoriation- none Lymphadenopathy- none Head- atraumatic            Eyes- Gross vision intact, PERRLA,  conjunctivae clear secretions            Ears- Hearing, canals normal            Nose- Clear, No-Septal dev, mucus, polyps, erosion, perforation             Throat- Mallampati II , mucosa clear , drainage- none, tonsils- atrophic Neck- flexible , trachea midline, no stridor , thyroid nl, carotid no bruit Chest - symmetrical excursion , unlabored           Heart/CV- RRR , no murmur , no gallop  , no rub, nl s1 s2                           - JVD- none , edema- none, stasis changes- none, varices- none           Lung- clear to P&A  Very distant, unlabored, wheeze- none,  , dullness-none, rub- none    +cough with deep rattle noted once           Chest wall-  Abd- tender-no, distended-no, bowel sounds-present, HSM- no Br/ Gen/ Rectal- Not done, not indicated Extrem-  cyanosis- none, clubbing, none, atrophy- none, strength- nl Neuro- grossly intact to observation         Assessment & Plan:

## 2011-05-10 NOTE — Assessment & Plan Note (Signed)
I can't offer medicines that will work better and be cheaper than what he has now. He has a significant chronic bronchitis cough. I have again advised that he stop the cigarettes completely.

## 2011-05-10 NOTE — Patient Instructions (Signed)
Continue present meds- please call as needed

## 2011-05-14 NOTE — Assessment & Plan Note (Signed)
Smoking cessation was again advised him of the medical issues and available support discussed. He is only smoking a couple of cigarettes a day but maintains his nicotine addiction with chewing tobacco.

## 2011-07-08 LAB — BASIC METABOLIC PANEL
CO2: 35 mEq/L — ABNORMAL HIGH (ref 19–32)
Calcium: 9.7 mg/dL (ref 8.4–10.5)
Chloride: 101 mEq/L (ref 96–112)
Creatinine, Ser: 1.24 mg/dL (ref 0.4–1.5)
GFR calc Af Amer: >60 mL/min (ref >60)
Sodium: 147 mEq/L — ABNORMAL HIGH (ref 135–145)

## 2011-07-08 LAB — HEMOGLOBIN AND HEMATOCRIT, BLOOD
HCT: 51.5 % (ref 39.0–52.0)
Hemoglobin: 16.9 g/dL (ref 13.0–17.0)

## 2011-07-08 LAB — PROTIME-INR: Prothrombin Time: 16.4 seconds — ABNORMAL HIGH (ref 11.6–15.2)

## 2011-07-08 LAB — APTT: aPTT: 23 seconds — ABNORMAL LOW (ref 24–37)

## 2011-09-22 ENCOUNTER — Other Ambulatory Visit: Payer: Self-pay | Admitting: Urology

## 2011-10-06 ENCOUNTER — Encounter (HOSPITAL_COMMUNITY): Payer: Self-pay | Admitting: Pharmacy Technician

## 2011-10-12 ENCOUNTER — Encounter (HOSPITAL_COMMUNITY): Payer: Self-pay | Admitting: *Deleted

## 2011-10-14 ENCOUNTER — Encounter (HOSPITAL_COMMUNITY): Payer: Self-pay | Admitting: *Deleted

## 2011-10-14 NOTE — Progress Notes (Deleted)
Pt last took Aspirin 81mg  today at 0930. [employer] says he must be off Aspirin exactly 72 hours prior to ESWL. Pam at [facility] notified.

## 2011-10-14 NOTE — Progress Notes (Signed)
Pt reminded no aspirin products and to take laxative Sunday pm.

## 2011-10-14 NOTE — H&P (Addendum)
History of Present Illness         F/u BPH, LUTS, nephrolithiasis and hematuria. Pt has history of kidney stones. He needed URS on left in 2009 for a 8 mm distal stone. CT 2010 showed bilateral stones and no hydro. No other imaging.  He underwent cystoscopy in 2008 and 2011 for hematuria which showed BPH and no stones or tumors.     He is taking tamsulosin. He said that his flow is weak, he has hesitancy and intermittent flow. He had gross hematuria for about a week and mild right flank pain last week. He's had mild dysuria since the URS in 2009. No fevers or chills. He denies any incontinence. He has had no stone passage and doesnt feel like he's passing a stone.   Renal US, KUB and CT Dec 2012 -  12 mm stone(s) at right UPJ and bilateral renal stones. No hydro. Also 5 mm and 2 mm stone in bladder.   Interval Hx: Pt follows up because he passed two stones which he brought with him. These stones correlate to the bladder stones on CT.   KUB today - 12 mm stone(s) still present at right UPJ/prox ureter. I showed CT and KUB images to patient and went over anatomy.  He stopped coumadin yesterday and took an ASA earlier this morning. He is on Lovenox.  He has been cleared by his PCP and cardiologist.   Past Medical History Problems  1. History of  Acute Myocardial Infarction V12.59 2. History of  Arthritis V13.4 3. History of  Atrial Fibrillation 427.31 4. History of  Cath Stent Placement 5. History of  Depression 311 6. History of  Emphysema 492.8 7. History of  Gout 274.9 8. History of  Heart Disease 429.9 9. History of  Permanent Pacemaker Model ___ 10. History of  Ureteral Stone Left 592.1  Surgical History Problems  1. History of  Cystoscopy With Ureteroscopy With Lithotripsy 2. History of  Pilonidal Cyst Resection - Simple 3. History of  Sinus Surgery 4. History of  Tonsillectomy  Current Meds 1. Aspirin 81 MG Oral Tablet; Therapy: (Recorded:21Oct2008) to 2. Colchicine 0.6  MG TABS; Therapy: (Recorded:21Oct2008) to 3. Coumadin 2 MG Oral Tablet; Therapy: (Recorded:21Oct2008) to 4. Crestor 10 MG Oral Tablet; Therapy: (Recorded:09Dec2011) to 5. Digoxin 0.125 MG Oral Tablet; Therapy: (Recorded:21Oct2008) to 6. Finasteride 5 MG Oral Tablet; Take 1 tablet every day; Therapy: 14Dec2012 to  (Evaluate:09Dec2013)  Requested for: 14Dec2012; Last Rx:14Dec2012 7. Foradil Aerolizer 12 MCG Inhalation Capsule; Therapy: (Recorded:09Dec2011) to 8. Lasix 20 MG Oral Tablet; Therapy: (Recorded:21Oct2008) to 9. Metoprolol Succinate ER 25 MG Oral Tablet Extended Release 24 Hour; Therapy:  (Recorded:21Oct2008) to 10. Midodrine HCl 2.5 MG Oral Tablet; Therapy: (Recorded:09Dec2011) to 11. Zantac 150 MG Oral Tablet; Therapy: (Recorded:21Oct2008) to 12. Zyrtec 10 MG TABS; Therapy: (Recorded:21Oct2008) to  Allergies Medication  1. Flomax CP24 2. Sulfa Drugs  Family History Problems  1. Paternal history of  Cancer 2. Family history of  Death In The Family Father 3. Family history of  Death In The Family Mother 4. Family history of  Family Health Status Number Of Children 2 sons 5. Maternal history of  Stroke Syndrome V17.1  Social History Problems  1. Alcohol Use uses whiskey once a week in coffee 2. Marital History - Widowed 3. Occupation: retired Visual merchandiser 4. Tobacco Use V15.82 smoked since kid  Vitals Vital Signs [Data Includes: Last 1 Day]  11Jan2013 12:38PM  BMI Calculated: 22.81 BSA Calculated: 1.85 Height: 5 ft  9 in Weight: 154 lb  Blood Pressure: 105 / 72 Temperature: 96.9 F Heart Rate: 86  Physical Exam Constitutional: Well nourished and well developed . No acute distress.  Pulmonary: No respiratory distress and normal respiratory rhythm and effort.  Cardiovascular: Heart rate and rhythm are normal . No peripheral edema.  Neuro/Psych:. Mood and affect are appropriate.    Results/Data Urine [Data Includes: Last 1 Day]   11Jan2013  COLOR YELLOW     APPEARANCE CLEAR   SPECIFIC GRAVITY 1.010   pH 5.5   GLUCOSE NEG mg/dL  BILIRUBIN NEG   KETONE NEG mg/dL  BLOOD LARGE   PROTEIN NEG mg/dL  UROBILINOGEN 0.2 mg/dL  NITRITE NEG   LEUKOCYTE ESTERASE TRACE   SQUAMOUS EPITHELIAL/HPF RARE   WBC 4-6 WBC/hpf  RBC TNTC RBC/hpf  BACTERIA NONE SEEN   CRYSTALS NONE SEEN   CASTS NONE SEEN    Assessment Assessed  1. Ureteral Stone 592.1  Discussion/Summary  Reviewed KUB image and CT image with patient. We discussed again the nature and risks of ESWL (failure to fragment, failure to pass fragments, obstruction, need for additional procedures/stenting, bleeding requiring transfusion or embolization, infection, damage to kidney/ureter/other structures, pain among others). I had him see Pam to go over his medicines and instructions again today.   I have reviewed the history and examined the patient.  There are no changes to the H&P. INR 1.24, PT 15.9. Pt off coumadin and Lovenox.  Date: 10/17/2011 Time: 2956 Doyne Keel, MD

## 2011-10-14 NOTE — Progress Notes (Signed)
Pt started taking Lovenox 10/14/11

## 2011-10-14 NOTE — Progress Notes (Signed)
Pam at Dr. Estil Daft office called to say Mr. Faciane had to have taken his Aspirin close to 0730 instead of 0930 10/14/11 because he was in their office at 1000 and he lives over an hour away in [place]. She has also spoken with personnel at [facility] and they say it is ok for Mr Scarantino to have his ESWL on 10/17/11. Dr. Mena Goes  Is also aware of this and agrees to proceed with ESWL

## 2011-10-14 NOTE — Progress Notes (Signed)
Willie [name] with [employer] [facility] was notified to be here at [facility] 10/17/11

## 2011-10-14 NOTE — Progress Notes (Signed)
Pt last took Aspirin 81mg  at 0730 10/14/11

## 2011-10-17 ENCOUNTER — Encounter (HOSPITAL_COMMUNITY): Payer: Self-pay | Admitting: *Deleted

## 2011-10-17 ENCOUNTER — Encounter (HOSPITAL_COMMUNITY)
Admission: RE | Disposition: A | Discharge status: Home / Self Care | Payer: Self-pay | Source: Ambulatory Visit | Attending: Urology

## 2011-10-17 ENCOUNTER — Ambulatory Visit (HOSPITAL_COMMUNITY)
Admission: RE | Admit: 2011-10-17 | Discharge: 2011-10-17 | Disposition: A | Discharge status: Home / Self Care | Payer: Medicare Other | Source: Ambulatory Visit | Attending: Urology | Admitting: Urology

## 2011-10-17 ENCOUNTER — Encounter (HOSPITAL_COMMUNITY): Payer: Self-pay | Admitting: Anesthesiology

## 2011-10-17 ENCOUNTER — Ambulatory Visit (HOSPITAL_COMMUNITY): Payer: Medicare Other | Admitting: Anesthesiology

## 2011-10-17 ENCOUNTER — Ambulatory Visit (HOSPITAL_COMMUNITY): Payer: Medicare Other

## 2011-10-17 DIAGNOSIS — N400 Enlarged prostate without lower urinary tract symptoms: Secondary | ICD-10-CM | POA: Insufficient documentation

## 2011-10-17 DIAGNOSIS — Z7982 Long term (current) use of aspirin: Secondary | ICD-10-CM | POA: Insufficient documentation

## 2011-10-17 DIAGNOSIS — Z950 Presence of cardiac pacemaker: Secondary | ICD-10-CM | POA: Insufficient documentation

## 2011-10-17 DIAGNOSIS — R300 Dysuria: Secondary | ICD-10-CM | POA: Insufficient documentation

## 2011-10-17 DIAGNOSIS — I10 Essential (primary) hypertension: Secondary | ICD-10-CM | POA: Insufficient documentation

## 2011-10-17 DIAGNOSIS — J449 Chronic obstructive pulmonary disease, unspecified: Secondary | ICD-10-CM | POA: Insufficient documentation

## 2011-10-17 DIAGNOSIS — J4489 Other specified chronic obstructive pulmonary disease: Secondary | ICD-10-CM | POA: Insufficient documentation

## 2011-10-17 DIAGNOSIS — R319 Hematuria, unspecified: Secondary | ICD-10-CM | POA: Insufficient documentation

## 2011-10-17 DIAGNOSIS — R109 Unspecified abdominal pain: Secondary | ICD-10-CM | POA: Insufficient documentation

## 2011-10-17 DIAGNOSIS — I4891 Unspecified atrial fibrillation: Secondary | ICD-10-CM | POA: Insufficient documentation

## 2011-10-17 DIAGNOSIS — I252 Old myocardial infarction: Secondary | ICD-10-CM | POA: Insufficient documentation

## 2011-10-17 DIAGNOSIS — Z79899 Other long term (current) drug therapy: Secondary | ICD-10-CM | POA: Insufficient documentation

## 2011-10-17 DIAGNOSIS — J321 Chronic frontal sinusitis: Secondary | ICD-10-CM | POA: Insufficient documentation

## 2011-10-17 DIAGNOSIS — N201 Calculus of ureter: Secondary | ICD-10-CM | POA: Insufficient documentation

## 2011-10-17 DIAGNOSIS — Z7901 Long term (current) use of anticoagulants: Secondary | ICD-10-CM | POA: Insufficient documentation

## 2011-10-17 DIAGNOSIS — I2510 Atherosclerotic heart disease of native coronary artery without angina pectoris: Secondary | ICD-10-CM | POA: Insufficient documentation

## 2011-10-17 HISTORY — DX: Acute myocardial infarction, unspecified: I21.9

## 2011-10-17 HISTORY — DX: Personal history of other diseases of the digestive system: Z87.19

## 2011-10-17 HISTORY — DX: Pneumonia, unspecified organism: J18.9

## 2011-10-17 HISTORY — DX: Essential (primary) hypertension: I10

## 2011-10-17 HISTORY — DX: Chronic kidney disease, unspecified: N18.9

## 2011-10-17 HISTORY — DX: Anemia, unspecified: D64.9

## 2011-10-17 HISTORY — DX: Depression, unspecified: F32.A

## 2011-10-17 HISTORY — DX: Major depressive disorder, single episode, unspecified: F32.9

## 2011-10-17 LAB — PROTIME-INR
INR: 1.24 (ref 0.00–1.49)
Prothrombin Time: 15.9 seconds — ABNORMAL HIGH (ref 11.6–15.2)

## 2011-10-17 SURGERY — LITHOTRIPSY, ESWL
Anesthesia: General | Laterality: Right

## 2011-10-17 MED ORDER — DIAZEPAM 5 MG PO TABS: 10.0000 mg | ORAL_TABLET | ORAL | Status: DC

## 2011-10-17 MED ORDER — HYDROCODONE-ACETAMINOPHEN 5-500 MG PO TABS: 1.0000 | ORAL_TABLET | Freq: Four times a day (QID) | ORAL | Status: AC | PRN

## 2011-10-17 MED ORDER — ASPIRIN EC 81 MG PO TBEC: 81.0000 mg | DELAYED_RELEASE_TABLET | Freq: Every day | ORAL | Status: AC

## 2011-10-17 MED ORDER — METOPROLOL SUCCINATE ER 25 MG PO TB24
25.0000 mg | ORAL_TABLET | Freq: Every day | ORAL | Status: DC
  Administered 2011-10-17: 25 mg via ORAL
  Filled 2011-10-17: qty 1

## 2011-10-17 MED ORDER — CIPROFLOXACIN HCL 500 MG PO TABS
500.0000 mg | ORAL_TABLET | ORAL | Status: AC
  Administered 2011-10-17: 500 mg via ORAL

## 2011-10-17 MED ORDER — ENOXAPARIN SODIUM 40 MG/0.4ML ~~LOC~~ SOLN: 40.0000 mg | SUBCUTANEOUS | Status: DC

## 2011-10-17 MED ORDER — CEPHALEXIN 500 MG PO CAPS: 500.0000 mg | ORAL_CAPSULE | Freq: Three times a day (TID) | ORAL | Status: AC

## 2011-10-17 MED ORDER — LACTATED RINGERS IV SOLN
INTRAVENOUS | Status: DC
  Administered 2011-10-17: 07:00:00 via INTRAVENOUS

## 2011-10-17 MED ORDER — DEXTROSE-NACL 5-0.45 % IV SOLN: INTRAVENOUS | Status: DC

## 2011-10-17 MED ORDER — CIPROFLOXACIN HCL 500 MG PO TABS
ORAL_TABLET | ORAL | Status: AC
  Administered 2011-10-17: 500 mg via ORAL
  Filled 2011-10-17: qty 1

## 2011-10-17 MED ORDER — DIPHENHYDRAMINE HCL 25 MG PO CAPS: 25.0000 mg | ORAL_CAPSULE | ORAL | Status: DC

## 2011-10-17 NOTE — ED Notes (Signed)
Report received from [facility] CRNA post lithotripsy. VSS.

## 2011-10-17 NOTE — Brief Op Note (Signed)
10/17/2011  8:40 AM  PATIENT:  Willie [name]  78 y.o. male  PRE-OPERATIVE DIAGNOSIS:  Right Ureteral Stone  POST-OPERATIVE DIAGNOSIS:  Right ureteral stone  PROCEDURE:  Procedure(s): EXTRACORPOREAL SHOCK WAVE LITHOTRIPSY (ESWL) RIGHT  SURGEON:  Surgeon(s): Antony Haste, MD   ANESTHESIA:   IV sedation  EBL:   0 ml  BLOOD ADMINISTERED:none  DRAINS: none   LOCAL MEDICATIONS USED:  NONE  SPECIMEN:  No Specimen  DISPOSITION OF SPECIMEN:  N/A  COUNTS:  YES  TOURNIQUET:  * No tourniquets in log *  DICTATION: . See scanned Op Note  PLAN OF CARE: Discharge to home after PACU observation and Pacer evaluation  PATIENT DISPOSITION:  PACU - hemodynamically stable.   Delay start of Pharmacological VTE agent (>24hrs) due to surgical blood loss or risk of bleeding:  {YES/NO/NOT APPLICABLE:20182

## 2011-10-17 NOTE — Anesthesia Preprocedure Evaluation (Addendum)
Anesthesia Evaluation  Patient identified by MRN, date of birth, ID band Patient awake    Reviewed: Allergy & Precautions, H&P , NPO status , Patient's Chart, lab work & pertinent test results, reviewed documented beta blocker date and time   Airway Mallampati: II TM Distance: >3 FB Neck ROM: full    Dental  (+) Missing   Pulmonary neg pulmonary ROS, shortness of breath and with exertion, pneumonia , COPD COPD inhaler, Recent URI ,  clear to auscultation  Pulmonary exam normal       Cardiovascular Exercise Tolerance: Good hypertension, + CAD, + Past MI and neg cardio ROS + dysrhythmias Atrial Fibrillation + pacemaker regular Normal    Neuro/Psych Negative Neurological ROS  Negative Psych ROS   GI/Hepatic negative GI ROS, Neg liver ROS, Poorly Controlled,  Endo/Other  Negative Endocrine ROS  Renal/GU negative Renal ROS  Genitourinary negative   Musculoskeletal   Abdominal   Peds  Hematology negative hematology ROS (+)   Anesthesia Other Findings   Reproductive/Obstetrics negative OB ROS                           Anesthesia Physical Anesthesia Plan  ASA: III  Anesthesia Plan: General   Post-op Pain Management:    Induction: Intravenous  Airway Management Planned: Simple Face Mask  Additional Equipment:   Intra-op Plan:   Post-operative Plan:   Informed Consent: I have reviewed the patients History and Physical, chart, labs and discussed the procedure including the risks, benefits and alternatives for the proposed anesthesia with the patient or authorized representative who has indicated his/her understanding and acceptance.   Dental Advisory Given  Plan Discussed with: CRNA  Anesthesia Plan Comments:         Anesthesia Quick Evaluation

## 2011-10-17 NOTE — Progress Notes (Signed)
Pt took laxative 10/16/11/ with good results

## 2011-10-17 NOTE — Preoperative (Signed)
Beta Blockers   Reason not to administer Beta Blockers:25 Metoprolol given at 0720 10-17-11

## 2011-10-17 NOTE — Anesthesia Postprocedure Evaluation (Signed)
  Anesthesia Post-op Note  Patient: Willie [name]  Procedure(s) Performed:  EXTRACORPOREAL SHOCK WAVE LITHOTRIPSY (ESWL)  Patient Location: PACU  Anesthesia Type: MAC  Level of Consciousness: awake and alert   Airway and Oxygen Therapy: Patient Spontanous Breathing  Post-op Pain: mild  Post-op Assessment: Post-op Vital signs reviewed, Patient's Cardiovascular Status Stable, Respiratory Function Stable, Patent Airway and No signs of Nausea or vomiting  Post-op Vital Signs: stable  Complications: No apparent anesthesia complications

## 2011-11-08 ENCOUNTER — Encounter: Payer: Self-pay | Admitting: Internal Medicine

## 2011-11-08 ENCOUNTER — Ambulatory Visit (INDEPENDENT_AMBULATORY_CARE_PROVIDER_SITE_OTHER): Payer: Medicare Other | Admitting: Internal Medicine

## 2011-11-08 VITALS — BP 102/62 | HR 114 | Ht 68.0 in | Wt 161.0 lb

## 2011-11-08 DIAGNOSIS — F17200 Nicotine dependence, unspecified, uncomplicated: Secondary | ICD-10-CM

## 2011-11-08 DIAGNOSIS — J4489 Other specified chronic obstructive pulmonary disease: Secondary | ICD-10-CM

## 2011-11-08 DIAGNOSIS — J449 Chronic obstructive pulmonary disease, unspecified: Secondary | ICD-10-CM

## 2011-11-08 MED ORDER — FORMOTEROL FUMARATE 12 MCG IN CAPS: 12.0000 ug | ORAL_CAPSULE | Freq: Two times a day (BID) | RESPIRATORY_TRACT | Status: DC

## 2011-11-08 NOTE — Patient Instructions (Signed)
Order- [facility]- is there assistance program for Foradil?  Please walk as much as you can for exercise, and keep trying to give up those las few cigarettes.

## 2011-11-08 NOTE — Progress Notes (Signed)
Patient ID: Willie [name], male    DOB: 02/09/1928, 78 y.o.   MRN: [id number]  HPI 05/10/11- 61 yoM smoker, followed for COPD, complicated by Hx AFib/ pacemaker Last here October 11, 2010 Denies major changes since last here.  Coughs often, clear phlegm. Mowed in heat 4 days ago. Blames that for a "cold" and is taking a Z pak from PCP Dr Benancio Deeds  in [place].  Still smoking 2 cigarettes daily after breakfast, chews during the day. Complains of cost of Foradil- really notices if he misses a dose .  Cites good report from Dr Alanda Amass yesterday.  CXR- 10/11/10- COPD, NAD, unchanged. Pacemaker in place.   11/08/11- 82 yoM smoker, followed for COPD, complicated by Hx AFib/ pacemaker Still smokes a few daily- I again went over medical concerns and offered available support. He has a Z-Pak prescription in reserve and takes Zithromax at first suspicion. He believes this has helped prevent significant respiratory infections this winter. He did get a flu shot. Using Foradil once daily with rare need for rescue inhaler. We discussed cost of medication alternatives.  Review of Systems-see HPI Constitutional:   No-   weight loss, night sweats, fevers, chills, fatigue, lassitude. HEENT:   No-  headaches, difficulty swallowing, tooth/dental problems, sore throat,       No-  sneezing, itching, ear ache, nasal congestion, post nasal drip,  CV:  No-   chest pain, orthopnea, PND, swelling in lower extremities, anasarca, dizziness, palpitations Resp: No- acute shortness of breath with exertion or at rest.              No-   productive cough,  mild non-productive cough,  No-  coughing up of blood.              No-   change in color of mucus.  No- wheezing.   Skin: No-   rash or lesions. GI:  No-   heartburn, indigestion, abdominal pain, nausea, vomiting, diarrhea,                 change in bowel habits, loss of appetite GU:  MS:  No-   joint pain or swelling.  No- decreased range of motion.  No- back  pain. Neuro- grossly normal to observation, Or:  Psych:  No- change in mood or affect. No depression or anxiety.  No memory loss.  Objective:   Physical Exam General- Alert, Oriented, Affect-appropriate, Distress- none acute Skin- rash-none, lesions- none, excoriation- none Lymphadenopathy- none Head- atraumatic            Eyes- Gross vision intact, PERRLA, conjunctivae clear secretions            Ears- Hearing diminished            Nose- Clear, No-Septal dev, mucus, polyps, erosion, perforation             Throat- Mallampati II , mucosa clear , drainage- none, tonsils- atrophic Neck- flexible , trachea midline, no stridor , thyroid nl, carotid no bruit Chest - symmetrical excursion , unlabored           Heart/CV- RRR , no murmur , no gallop  , no rub, nl s1 s2                           - JVD- none , edema- none, stasis changes- none, varices- none           Lung- clear to  P&A-  Very distant, unlabored, wheeze- none,  , dullness-none, rub- none               Chest wall- floating rib lower right clicks with deep breath ( fell on a bottle)not tender Abd- Br/ Gen/ Rectal- Not done, not indicated Extrem- cyanosis- none, clubbing, none, atrophy- none, strength- nl Neuro- grossly intact to observation

## 2011-11-11 NOTE — Assessment & Plan Note (Signed)
He admits to only a very few cigarettes daily but I pressed him to quit completely now.

## 2011-11-11 NOTE — Assessment & Plan Note (Signed)
Stable COPD. We discussed ways to save money on medications. He may decide to follow with his primary physician and return here only if needed because his COPD has been well controlled.

## 2011-11-25 ENCOUNTER — Other Ambulatory Visit: Payer: Self-pay | Admitting: *Deleted

## 2011-11-25 MED ORDER — FORMOTEROL FUMARATE 12 MCG IN CAPS: 12.0000 ug | ORAL_CAPSULE | Freq: Two times a day (BID) | RESPIRATORY_TRACT | Status: DC

## 2012-02-08 ENCOUNTER — Other Ambulatory Visit: Payer: Self-pay | Admitting: *Deleted

## 2012-02-08 MED ORDER — FORMOTEROL FUMARATE 12 MCG IN CAPS: 12.0000 ug | ORAL_CAPSULE | Freq: Two times a day (BID) | RESPIRATORY_TRACT | Status: DC

## 2012-03-20 ENCOUNTER — Encounter: Payer: Self-pay | Admitting: Internal Medicine

## 2012-03-20 ENCOUNTER — Ambulatory Visit (INDEPENDENT_AMBULATORY_CARE_PROVIDER_SITE_OTHER): Payer: Medicare Other | Admitting: Internal Medicine

## 2012-03-20 VITALS — BP 112/74 | HR 66 | Ht 68.0 in | Wt 151.8 lb

## 2012-03-20 DIAGNOSIS — J449 Chronic obstructive pulmonary disease, unspecified: Secondary | ICD-10-CM

## 2012-03-20 DIAGNOSIS — J4489 Other specified chronic obstructive pulmonary disease: Secondary | ICD-10-CM

## 2012-03-20 DIAGNOSIS — F17200 Nicotine dependence, unspecified, uncomplicated: Secondary | ICD-10-CM

## 2012-03-20 DIAGNOSIS — I4891 Unspecified atrial fibrillation: Secondary | ICD-10-CM

## 2012-03-20 NOTE — Patient Instructions (Addendum)
Sample Arcapta inhaler- 1  Daily. Try this instead of the Foradil, for comparison.  Please call as needed

## 2012-03-20 NOTE — Progress Notes (Signed)
Patient ID: Willie [name], male    DOB: 1928-06-27, 78 y.o.   MRN: [id number]  HPI 05/10/11- 78 yoM smoker, followed for COPD, complicated by Hx AFib/ pacemaker Last here October 11, 2010 Denies major changes since last here.  Coughs often, clear phlegm. Mowed in heat 4 days ago. Blames that for a "cold" and is taking a Z pak from PCP Dr Benancio Deeds  in [place].  Still smoking 2 cigarettes daily after breakfast, chews during the day. Complains of cost of Foradil- really notices if he misses a dose .  Cites good report from Dr Alanda Amass yesterday.  CXR- 10/11/10- COPD, NAD, unchanged. Pacemaker in place.   11/08/11- 78 yoM smoker, followed for COPD, complicated by Hx AFib/ pacemaker Still smokes a few daily- I again went over medical concerns and offered available support. He has a Z-Pak prescription in reserve and takes Zithromax at first suspicion. He believes this has helped prevent significant respiratory infections this winter. He did get a flu shot. Using Foradil once daily with rare need for rescue inhaler. We discussed cost of medication alternatives.  03/20/12-  83 yoM smoker, followed for COPD, complicated by Hx AFib/ pacemaker Denies any SOB, wheezing, cough, or congestion at this time-will give out quickly if active Dyspnea on exertion associated with using arms or with brisk walking. He denies chest pain or claudication. Notices intentional tremor and discussed medication effect. He chews tobacco. Diagnosis of glaucoma prevents use of Spiriva. His COPD assessment test (CAT) score today is 15/40 CXR 10/11/10 IMPRESSION:  COPD. No acute cardiopulmonary disease.  Provider: Bud Face   Review of Systems-see HPI Constitutional:   No-   weight loss, night sweats, fevers, chills, fatigue, lassitude. HEENT:   No-  headaches, difficulty swallowing, tooth/dental problems, sore throat,       No-  sneezing, itching, ear ache, nasal congestion, post nasal drip,  CV:  No-   chest pain,  orthopnea, PND, swelling in lower extremities, anasarca, dizziness, palpitations Resp: + shortness of breath with exertion or at rest.              No-   productive cough,  mild non-productive cough,  No-  coughing up of blood.              No-   change in color of mucus.  No- wheezing.   Skin: No-   rash or lesions. GI:  No-   heartburn, indigestion, abdominal pain, nausea, vomiting,  GU:  MS:  No-   joint pain or swelling.   Neuro- nothing unusual  Psych:  No- change in mood or affect. No depression or anxiety.  No memory loss.  Objective:   Physical Exam General- Alert, Oriented, Affect-appropriate, Distress- none acute Skin- rash-none, lesions- none, excoriation- none Lymphadenopathy- none Head- atraumatic            Eyes- Gross vision intact, PERRLA, conjunctivae clear secretions            Ears- Hearing diminished            Nose- Clear, No-Septal dev, mucus, polyps, erosion, perforation             Throat- Mallampati II , mucosa clear , drainage- none, tonsils- atrophic Neck- flexible , trachea midline, no stridor , thyroid nl, carotid no bruit Chest - symmetrical excursion , unlabored           Heart/CV- RRR , no murmur , no gallop  , no rub, nl s1  s2                           - JVD- none , edema- none, stasis changes- none, varices- none           Lung- clear to P&A-  Very distant, unlabored, wheeze- none,  , dullness-none, rub- none               Chest wall- persistent floating rib lower right- clicks with deep breath ( fell on a bottle)not tender Abd- Br/ Gen/ Rectal- Not done, not indicated Extrem- cyanosis- none, clubbing, none, atrophy- none, strength- nl Neuro- grossly intact to observation

## 2012-03-26 ENCOUNTER — Telehealth: Payer: Self-pay | Admitting: Internal Medicine

## 2012-03-26 NOTE — Telephone Encounter (Signed)
lmomtcb x1 for pt 

## 2012-03-27 MED ORDER — INDACATEROL MALEATE 75 MCG IN CAPS: 1.0000 | ORAL_CAPSULE | Freq: Every day | RESPIRATORY_TRACT | Status: DC

## 2012-03-27 NOTE — Telephone Encounter (Signed)
I spoke with the pt and he states arcapta is working well for him and he would like a Rx called in to [facility]. Refill sent. Carron Curie, CMA

## 2012-03-28 NOTE — Assessment & Plan Note (Addendum)
Rhythm today is either normal sinus or a very well controlled atrial fibrillation. We are going to see if he can tolerate a 24-hour bronchodilator.

## 2012-03-28 NOTE — Assessment & Plan Note (Addendum)
Still chewing tobacco and smoking an occasional cigarette. I discussed this with him today.

## 2012-03-28 NOTE — Assessment & Plan Note (Signed)
Exertional dyspnea may reflect cardiac as well as pulmonary components. Plan-smoking cessation. Try sample Arcapta

## 2012-05-01 ENCOUNTER — Telehealth: Payer: Self-pay | Admitting: Internal Medicine

## 2012-05-01 NOTE — Telephone Encounter (Signed)
ATC x 2 line busy. WCB. Nakul Avino, CMA  

## 2012-05-02 NOTE — Telephone Encounter (Signed)
He should not take both Symbicort and Arcapta. These both have long-acting bronchodilators and the combination would risk aggravating his atrial fib.

## 2012-05-02 NOTE — Telephone Encounter (Signed)
I spoke with cory and is aware of this. He states he will call pt to see which he prefers to stay on. I also called pt to see if he wants to stay on arcapta or try symbicort. --lmomtcb x1 for pt

## 2012-05-02 NOTE — Telephone Encounter (Signed)
Called [facility], spoke with Kandee Keen.  States they received rx yesterday from Dr. Clarita Crane at [facility] for symbicort 160 1 puff bid - thinks pt may have been d/c'd from the hospital.  They filled rx for arcapta approx 1 wk ago.  Kandee Keen would like to clarify these inhalers -- ? Should be pt on both meds or just one?  Ok with call back tomorrow as CDY is out of office today.  Dr. Maple Hudson, pls advise.  Thank you.

## 2012-05-03 NOTE — Telephone Encounter (Signed)
I spoke with the pt son and the pt and the pt wants to just take the arcapta only. I advised to stop the symbicort. Pt states understanding.Carron Curie, CMA

## 2012-08-01 ENCOUNTER — Other Ambulatory Visit: Payer: Self-pay | Admitting: Internal Medicine

## 2012-08-01 MED ORDER — INDACATEROL MALEATE 75 MCG IN CAPS: 1.0000 | ORAL_CAPSULE | Freq: Every day | RESPIRATORY_TRACT | Status: DC

## 2012-09-20 ENCOUNTER — Ambulatory Visit: Payer: Medicare Other | Admitting: Internal Medicine

## 2012-09-24 ENCOUNTER — Ambulatory Visit: Payer: Medicare Other | Admitting: Internal Medicine

## 2012-10-04 ENCOUNTER — Other Ambulatory Visit (HOSPITAL_COMMUNITY): Payer: Self-pay | Admitting: Cardiovascular Disease

## 2012-10-04 DIAGNOSIS — I729 Aneurysm of unspecified site: Secondary | ICD-10-CM

## 2012-10-04 DIAGNOSIS — R0989 Other specified symptoms and signs involving the circulatory and respiratory systems: Secondary | ICD-10-CM

## 2012-10-31 ENCOUNTER — Encounter (HOSPITAL_COMMUNITY): Payer: Medicare Other

## 2012-11-01 ENCOUNTER — Ambulatory Visit (HOSPITAL_COMMUNITY)
Admission: RE | Admit: 2012-11-01 | Discharge: 2012-11-01 | Disposition: A | Discharge status: Home / Self Care | Payer: Medicare Other | Source: Ambulatory Visit | Attending: Cardiovascular Disease | Admitting: Cardiovascular Disease

## 2012-11-01 DIAGNOSIS — R0989 Other specified symptoms and signs involving the circulatory and respiratory systems: Secondary | ICD-10-CM | POA: Insufficient documentation

## 2012-11-01 DIAGNOSIS — I729 Aneurysm of unspecified site: Secondary | ICD-10-CM

## 2012-11-01 NOTE — Progress Notes (Signed)
Carotid Doppler Completed. Chauncy Lean

## 2012-11-01 NOTE — Progress Notes (Signed)
Aorta Duplex Completed. Willie [name] D  

## 2012-11-22 ENCOUNTER — Other Ambulatory Visit: Payer: Self-pay | Admitting: Internal Medicine

## 2012-11-22 MED ORDER — INDACATEROL MALEATE 75 MCG IN CAPS: 1.0000 | ORAL_CAPSULE | Freq: Every day | RESPIRATORY_TRACT | Status: DC

## 2012-11-22 NOTE — Telephone Encounter (Signed)
Received faxed refill request from [facility] Arcapta QD Last ov 6.18.13 Refills sent.

## 2013-01-08 ENCOUNTER — Telehealth: Payer: Self-pay | Admitting: Internal Medicine

## 2013-01-08 NOTE — Telephone Encounter (Signed)
Spoke with pharmacist and informed that we do not have specific savings cards for Aracpta but we do have the [employer] discount card but pharmacist states that he doesn't think this will help the pt.  Pharmacist states that he will continue working with the company to see if they can assist pt.

## 2013-01-18 ENCOUNTER — Encounter: Payer: Self-pay | Admitting: Pharmacist Clinician (PhC)/ Clinical Pharmacy Specialist

## 2013-01-18 DIAGNOSIS — I4891 Unspecified atrial fibrillation: Secondary | ICD-10-CM

## 2013-01-18 DIAGNOSIS — Z7901 Long term (current) use of anticoagulants: Secondary | ICD-10-CM | POA: Insufficient documentation

## 2013-01-21 ENCOUNTER — Encounter: Payer: Self-pay | Admitting: *Deleted

## 2013-03-25 ENCOUNTER — Other Ambulatory Visit: Payer: Self-pay | Admitting: Pharmacist Clinician (PhC)/ Clinical Pharmacy Specialist

## 2013-03-25 MED ORDER — WARFARIN SODIUM 2 MG PO TABS: ORAL_TABLET | ORAL | Status: DC

## 2013-05-20 ENCOUNTER — Other Ambulatory Visit: Payer: Self-pay

## 2013-05-20 MED ORDER — MIDODRINE HCL 2.5 MG PO TABS: 2.5000 mg | ORAL_TABLET | Freq: Two times a day (BID) | ORAL | Status: DC

## 2013-05-20 NOTE — Telephone Encounter (Signed)
Rx was faxed to pharmacy.  

## 2013-05-27 NOTE — Telephone Encounter (Signed)
Entry Error

## 2013-06-12 LAB — PACEMAKER DEVICE OBSERVATION

## 2013-06-17 ENCOUNTER — Telehealth: Payer: Self-pay | Admitting: Pharmacist Clinician (PhC)/ Clinical Pharmacy Specialist

## 2013-06-17 NOTE — Telephone Encounter (Signed)
Overdue INR letter sent 

## 2013-06-26 ENCOUNTER — Telehealth: Payer: Self-pay | Admitting: Pharmacist Clinician (PhC)/ Clinical Pharmacy Specialist

## 2013-06-26 ENCOUNTER — Ambulatory Visit: Payer: Self-pay | Admitting: Pharmacist Clinician (PhC)/ Clinical Pharmacy Specialist

## 2013-06-26 ENCOUNTER — Encounter: Payer: Self-pay | Admitting: Cardiovascular Disease

## 2013-06-26 DIAGNOSIS — I4891 Unspecified atrial fibrillation: Secondary | ICD-10-CM

## 2013-06-26 DIAGNOSIS — Z7901 Long term (current) use of anticoagulants: Secondary | ICD-10-CM

## 2013-06-26 NOTE — Telephone Encounter (Signed)
Mr. Willie [name] states that he received a letter from [place] yesterday telling Mr. Willie [name] he is over due for a protime check.  His primary care physician took him off coumadin yesterday.

## 2013-07-24 ENCOUNTER — Ambulatory Visit (INDEPENDENT_AMBULATORY_CARE_PROVIDER_SITE_OTHER): Payer: Medicare Other | Admitting: Cardiovascular Disease

## 2013-07-24 ENCOUNTER — Encounter: Payer: Self-pay | Admitting: Cardiovascular Disease

## 2013-07-24 VITALS — BP 126/60 | HR 80 | Resp 20 | Ht 68.0 in | Wt 161.0 lb

## 2013-07-24 DIAGNOSIS — J449 Chronic obstructive pulmonary disease, unspecified: Secondary | ICD-10-CM

## 2013-07-24 DIAGNOSIS — D509 Iron deficiency anemia, unspecified: Secondary | ICD-10-CM

## 2013-07-24 DIAGNOSIS — I6529 Occlusion and stenosis of unspecified carotid artery: Secondary | ICD-10-CM

## 2013-07-24 DIAGNOSIS — I4891 Unspecified atrial fibrillation: Secondary | ICD-10-CM

## 2013-07-24 DIAGNOSIS — I6522 Occlusion and stenosis of left carotid artery: Secondary | ICD-10-CM

## 2013-07-24 DIAGNOSIS — I739 Peripheral vascular disease, unspecified: Secondary | ICD-10-CM

## 2013-07-24 DIAGNOSIS — Z950 Presence of cardiac pacemaker: Secondary | ICD-10-CM

## 2013-07-24 DIAGNOSIS — I723 Aneurysm of iliac artery: Secondary | ICD-10-CM

## 2013-07-24 DIAGNOSIS — I714 Abdominal aortic aneurysm, without rupture, unspecified: Secondary | ICD-10-CM

## 2013-07-24 DIAGNOSIS — J4489 Other specified chronic obstructive pulmonary disease: Secondary | ICD-10-CM

## 2013-07-24 DIAGNOSIS — I2510 Atherosclerotic heart disease of native coronary artery without angina pectoris: Secondary | ICD-10-CM

## 2013-07-24 DIAGNOSIS — I951 Orthostatic hypotension: Secondary | ICD-10-CM

## 2013-07-24 DIAGNOSIS — R609 Edema, unspecified: Secondary | ICD-10-CM

## 2013-07-24 DIAGNOSIS — R600 Localized edema: Secondary | ICD-10-CM

## 2013-07-24 LAB — PACEMAKER DEVICE OBSERVATION

## 2013-07-24 NOTE — Patient Instructions (Signed)
Your physician has requested that you have a carotid duplex. This test is an ultrasound of the carotid arteries in your neck. It looks at blood flow through these arteries that supply the brain with blood. Allow one hour for this exam. There are no restrictions or special instructions.  Your physician has requested that you have a lower extremity arterial exercise duplex. During this test, exercise and ultrasound are used to evaluate arterial blood flow in the legs. Allow one hour for this exam. There are no restrictions or special instructions.  Your physician recommends that you schedule a follow-up appointment in: 6 months  Your physician has recommended you make the following change in your medication: STOP FLUDROCORTISONE (FLUORINEF)

## 2013-07-26 ENCOUNTER — Ambulatory Visit: Payer: Medicare Other | Admitting: Cardiovascular Disease

## 2013-07-29 LAB — PACEMAKER DEVICE OBSERVATION
BATTERY VOLTAGE: 2.99 v
BMOD-0002RV: 12
BRDY-0002RV: 55 {beats}/min
BRDY-0004RV: 120 {beats}/min
DEVICE MODEL PM: 7078932
RV LEAD AMPLITUDE: 5.9 mv
RV LEAD IMPEDENCE PM: 490 Ohm
RV LEAD THRESHOLD: 0.5 v
VENTRICULAR PACING PM: 23

## 2013-08-02 ENCOUNTER — Encounter (HOSPITAL_COMMUNITY): Payer: Medicare Other

## 2013-08-02 ENCOUNTER — Inpatient Hospital Stay (HOSPITAL_COMMUNITY): Admission: RE | Admit: 2013-08-02 | Payer: Medicare Other | Source: Ambulatory Visit

## 2013-08-04 ENCOUNTER — Encounter: Payer: Self-pay | Admitting: Cardiovascular Disease

## 2013-08-04 DIAGNOSIS — D509 Iron deficiency anemia, unspecified: Secondary | ICD-10-CM | POA: Insufficient documentation

## 2013-08-04 DIAGNOSIS — I951 Orthostatic hypotension: Secondary | ICD-10-CM | POA: Insufficient documentation

## 2013-08-04 DIAGNOSIS — I739 Peripheral vascular disease, unspecified: Secondary | ICD-10-CM | POA: Insufficient documentation

## 2013-08-04 DIAGNOSIS — Z950 Presence of cardiac pacemaker: Secondary | ICD-10-CM | POA: Insufficient documentation

## 2013-08-04 DIAGNOSIS — R600 Localized edema: Secondary | ICD-10-CM | POA: Insufficient documentation

## 2013-08-04 NOTE — Assessment & Plan Note (Signed)
In the past his orthostatic hypotension was treated with ProAmatine. He is no longer receiving this medication but has been given fludrocortisone. At the same time he has been prescribed furosemide which is obviously in direct contradiction with aldosterone agonist therapy. Alpha blocker therapy for his prostate issues is also a problem. Dizziness is not a big complaint at this time so I have elected to stop the fludrocortisone. If he develops orthostatic hypotension again, rather than restarting this medication will consider discontinuing his furosemide and tamsulosin and/or adding back ProAmatine.

## 2013-08-04 NOTE — Progress Notes (Signed)
Patient ID: Willie [name], male   DOB: September 15, 1928, 79 y.o.   MRN: [id number]      Reason for office visit CAD, PVD, chronic atrial fibrillation, shortness of breath  Is my first meeting with Willie [name] was previously followed by Dr. Susa Griffins  Willie [name] is an elderly gentleman with advanced chronic lung disease, who follows up with Dr. Jetty Duhamel. He has numerous cardiac and vascular problems that include coronary disease, carotid occlusion, abdominal aortic aneurysm, permanent atrial fibrillation and a history of bradycardia for which he initially received a pacemaker in 1991 (last generator change 2010).  He was recently hospitalized at [facility] for shortness of breath. He doesn't remember the name of his physician or even of the hospital. He recalls having issues with mild ankle edema as well as severe dyspnea. He has not had recent dizziness or syncope. He does have a history of orthostatic hypotension for which Dr. Alanda Amass previously prescribed ProAmatine. He is no longer receiving this medication but is taking both furosemide and fludrocortisone.  A stent was placed in his LAD artery in the year 2000 and has not had any new coronary event since. He had a low risk nuclear stress test about a year ago. He does not have angina pectoris. Both by the nuclear test and an echocardiogram performed in July of last year he has virtually normal left ventricular systolic function (borderline LVEF secondary to a synchronous septal motion.  His pacemaker is functioning normally. The only paces the ventricle 23% of the time. His dual-chamber device is programmed to VVIR for permanent atrial fibrillation. He takes warfarin anticoagulation and has not had a recent problems with bleeding. He did have to undergo lithotripsy in January 2013 for a right ureteral stone.   Allergies  Allergen Reactions  . Sulfonamide Derivatives     REACTION: Swelling of jaws and had welps    Current  Outpatient Prescriptions  Medication Sig Dispense Refill  . albuterol (PROAIR HFA) 108 (90 BASE) MCG/ACT inhaler Inhale 2 puffs into the lungs 4 (four) times daily as needed. For shortness of breath      . aspirin EC 81 MG tablet Take 1 tablet (81 mg total) by mouth daily.      Marland Kitchen atorvastatin (LIPITOR) 20 MG tablet Take 20 mg by mouth daily.        . chlorpheniramine (CHLOR-TRIMETON) 4 MG tablet Take 4 mg by mouth daily.       . colchicine-probenecid 0.5-500 MG per tablet Take 1 tablet by mouth Twice daily.      . digoxin (LANOXIN) 0.125 MG tablet Take 125 mcg by mouth daily.       . finasteride (PROSCAR) 5 MG tablet Take 5 mg by mouth daily.        . fludrocortisone (FLORINEF) 0.1 MG tablet Take 0.1 mg by mouth 2 (two) times daily.      . furosemide (LASIX) 20 MG tablet Take 20 mg by mouth daily. Or as directed      . Indacaterol Maleate (ARCAPTA NEOHALER) 75 MCG CAPS Place 1 capsule into inhaler and inhale daily.  30 capsule  3  . metoprolol tartrate (LOPRESSOR) 25 MG tablet Take 12.5 mg by mouth daily.       . nepafenac (NEVANAC) 0.1 % ophthalmic suspension Place 1 drop into both eyes daily.        . probenecid (BENEMID) 500 MG tablet Take 500 mg by mouth 2 (two) times daily.        Marland Kitchen  Tamsulosin HCl (FLOMAX) 0.4 MG CAPS Take 0.4 mg by mouth 2 (two) times daily.       Marland Kitchen warfarin (COUMADIN) 2 MG tablet Take 1.5 tablets by mouth daily as directed.  Need blood work for further refills  45 tablet  0  . formoterol (FORADIL) 12 MCG capsule for inhaler Place 1 capsule (12 mcg total) into inhaler and inhale 2 (two) times daily.  60 capsule  3  . levalbuterol (XOPENEX) 0.63 MG/3ML nebulizer solution daily.      Marland Kitchen oxybutynin (DITROPAN) 5 MG tablet Take 5 mg by mouth 2 (two) times daily.        . potassium chloride (K-DUR) 10 MEQ tablet Take 10 mEq by mouth daily.      . SYMBICORT 160-4.5 MCG/ACT inhaler as needed.      Pauline Aus HFA 45 MCG/ACT inhaler daily.       No current facility-administered  medications for this visit.    Past Medical History  Diagnosis Date  . Osteoarthrosis, unspecified whether generalized or localized, unspecified site   . Coronary atherosclerosis of unspecified type of vessel, native or graft   . Chronic rhinitis   . Chronic airway obstruction, not elsewhere classified   . Atrial fibrillation     chronic-coumadin , VVIR pacemaker-Dr. Alanda Amass  . Gout     Dr Novella Rob  . Chronic kidney disease     mild renal insufficiency  . Shortness of breath   . Hypertension   . Myocardial infarction   . Recurrent upper respiratory infection (URI)     cold 2 weeks ago  . Pneumonia     10 years ago  . Anemia   . H/O hiatal hernia   . Depression   . Angina     Past Surgical History  Procedure Laterality Date  . Pacemaker insertion    . Pacemaker insertion  about 36 years ago  . Tonsillectomy    . Coccyx removal  24 yr ago    Family History  Problem Relation Age of Onset  . COPD Brother   . Cancer Father   . Stroke Mother     History   Social History  . Marital Status: Married    Spouse Name: N/A    Number of Children: N/A  . Years of Education: N/A   Occupational History  . OWNER     self employeed-hog farmer   Social History Main Topics  . Smoking status: Current Every Day Smoker    Types: Cigarettes  . Smokeless tobacco: Current User    Types: Chew     Comment: smokes 1 cigarette a day  . Alcohol Use: Yes     Comment: occ  . Drug Use: No  . Sexual Activity: Not on file   Other Topics Concern  . Not on file   Social History Narrative  . No narrative on file    Review of systems: The patient specifically denies any chest pain at rest or with exertion, dyspnea at rest (but becomes dyspneic with minimal activity), orthopnea, paroxysmal nocturnal dyspnea, syncope, palpitations, focal neurological deficits, intermittent claudication, lower extremity edema, unexplained weight gain. He has a chronic cough, with production of mucoid  sputum, but no hemoptysis. He has frequent wheezing.  The patient denies abdominal pain, nausea, vomiting, dysphagia, diarrhea, constipation, polyuria, polydipsia, dysuria, hematuria, frequency, urgency, abnormal bleeding or bruising, fever, chills, unexpected weight changes, mood swings, change in skin or hair texture, change in voice quality, auditory or visual problems, allergic  reactions or rashes, new musculoskeletal complaints other than usual "aches and pains".  He has not had any recent problems with dizziness presyncope/syncope   PHYSICAL EXAM BP 126/60  Pulse 80  Resp 20  Ht 5\' 8"  (1.727 m)  Wt 161 lb (73.029 kg)  BMI 24.49 kg/m2  General: Alert, oriented x3, no distress Head: no evidence of trauma, PERRL, EOMI, no exophtalmos or lid lag, no myxedema, no xanthelasma; normal ears, nose and oropharynx Neck: non-elevated jugular venous pulsations and no hepatojugular reflux, but there is a prominent V wave; brisk carotid pulses without delay and no carotid bruits. Even the left carotid pulse appears to have flow volume Chest: He has a barrel chest and diminished breath sounds throughout , hyperresonant to percussion, no signs of consolidation by percussion or palpation, normal fremitus, symmetrical and full respiratory excursions. Wheezes are heard only on full forced expiration Cardiovascular: normal position and quality of the apical impulse, irregular rhythm, normal first and second heart sounds, no rubs or gallops. Grade 2-3/6 all systolic murmur at the left lower sternal border Abdomen: no tenderness or distention, no masses by palpation, no abnormal pulsatility or arterial bruits, normal bowel sounds, no hepatosplenomegaly Extremities: no clubbing, cyanosis; 2-3+ edema; 2+ radial, ulnar and brachial pulses bilaterally; 2+ right femoral, posterior tibial and dorsalis pedis pulses; 2+ left femoral, posterior tibial and dorsalis pedis pulses; no subclavian or femoral  bruits Neurological: grossly nonfocal   EKG: Atrial fibrillation probable LVH lateral ST segment changes may reflect LVH/ischemia or digoxin effect  Lipid Panel  November 2013 total cholesterol 123, triglycerides 51, HDL 54, LDL 59  BMET 09/14/2013 creatinine 1.3, sodium 149, potassium 3.3 CO2 37 hemoglobin 12 (microcytic), white blood cell count 7.9k Note that his hemoglobin was 9.4 with microcytic indices as recently as September 23   ASSESSMENT AND PLAN Permanent atrial fibrillation Good rate control. On appropriate warfarin anticoagulation. As far as I know he has not had a stroke, TIA or other embolic events in the past.  CORONARY ARTERY DISEASE He has a remote history of a stent placed to the LAD artery in the year 2000 and has moderate disease in the branches of the left circumflex coronary artery by his most recent angiogram in 2006. A nuclear stress test performed in April of 2013 showed low risk findings without any reversible perfusion defects. He is asymptomatic.  CHRONIC OBSTRUCTIVE PULMONARY DISEASE This appears to be his major cause for functional impairment, although it is hard to separate out any component of cardiac disease.. There is evidence of moderate pulmonary hypertension and severe tricuspid insufficiency, most likely related to cor pulmonale. Since he has atrial fibrillation his echo was not very reliable to show whether he has elevated left heart pressures.  PAD (peripheral artery disease) He has extensive atherosclerotic changes with occlusion of the left internal carotid artery and mild plaque on the right as well as a small infrarenal abdominal aortic aneurysm and slowly enlarging bilateral common iliac artery aneurysms. These should be reevaluated in January.  Pacemaker - [employer] Acc[employer] DR RF 2010 (initial pacemaker implant 1991), programmed VVIR Pacemaker check in clinic:normal device function.  No high ventricular rates noted. Patient's reaction time for  his RR was changed from fast to medium, and episode parameters were also adjusted (HVR cycles from 5-20, and HV rate 175-150). Estimated longevity 10->11 years. Patient will follow up remotely on 10-25-2013, and with MD in 1 year.   Orthostatic hypotension In the past his orthostatic hypotension was treated with  ProAmatine. He is no longer receiving this medication but has been given fludrocortisone. At the same time he has been prescribed furosemide which is obviously in direct contradiction with aldosterone agonist therapy. Alpha blocker therapy for his prostate issues is also a problem. Dizziness is not a big complaint at this time so I have elected to stop the fludrocortisone. If he develops orthostatic hypotension again, rather than restarting this medication will consider discontinuing his furosemide and tamsulosin and/or adding back ProAmatine.  Bilateral leg edema The fludrocortisone is probably contributing to his edema, although there may be a component of cor pulmonale/right heart failure. The fludrocortisone is also likely responsible for his hypernatremia and hypokalemia and all of this should improve once he discontinues the medication. If he becomes symptomatic from orthostatic hypotension this medicine should not be restarted but as mentioned above I would illuminate treatment with his alpha-blocker and loop diuretic and maybe even back down on the dose of carvedilol.  Microcytic anemia I am not certain why his hemoglobin went from 9 to 12 in just a month's time. He is not receiving treatment with iron. It is possible he received a transfusion during his hospitalization. In turn this may have contributed to his hypervolemia. If he does have iron deficiency anemia this could both be related to treatment with warfarin. Unfortunately I'm not sure whetehr he has had a GI workup for bleeding.   Orders Placed This Encounter  Procedures  . Pacemaker Device Observation  . Carotid duplex  .  Aorto-Iliac duplex   Meds ordered this encounter  Medications  . fludrocortisone (FLORINEF) 0.1 MG tablet    Sig: Take 0.1 mg by mouth 2 (two) times daily.  . potassium chloride (K-DUR) 10 MEQ tablet    Sig: Take 10 mEq by mouth daily.  . SYMBICORT 160-4.5 MCG/ACT inhaler    Sig: as needed.  . levalbuterol (XOPENEX) 0.63 MG/3ML nebulizer solution    Sig: daily.  Pauline Aus HFA 45 MCG/ACT inhaler    Sig: daily.    Junious Silk, MD, [facility] [phone] office [phone] pager

## 2013-08-04 NOTE — Assessment & Plan Note (Signed)
Good rate control. On appropriate warfarin anticoagulation. As far as I know he has not had a stroke, TIA or other embolic events in the past.

## 2013-08-04 NOTE — Assessment & Plan Note (Signed)
This appears to be his major cause for functional impairment, although it is hard to separate out any component of cardiac disease.. There is evidence of moderate pulmonary hypertension and severe tricuspid insufficiency, most likely related to cor pulmonale. Since he has atrial fibrillation his echo was not very reliable to show whether he has elevated left heart pressures.

## 2013-08-04 NOTE — Assessment & Plan Note (Addendum)
I am not certain why his hemoglobin went from 9 to 12 in just a month's time. He is not receiving treatment with iron. It is possible he received a transfusion during his hospitalization. In turn this may have contributed to his hypervolemia. If he does have iron deficiency anemia this could both be related to treatment with warfarin. Unfortunately I'm not sure whetehr he has had a GI workup for bleeding.

## 2013-08-04 NOTE — Assessment & Plan Note (Signed)
Pacemaker check in clinic:normal device function.  No high ventricular rates noted. Patient's reaction time for his RR was changed from fast to medium, and episode parameters were also adjusted (HVR cycles from 5-20, and HV rate 175-150). Estimated longevity 10->11 years. Patient will follow up remotely on 10-25-2013, and with MD in 1 year.

## 2013-08-04 NOTE — Assessment & Plan Note (Signed)
The fludrocortisone is probably contributing to his edema, although there may be a component of cor pulmonale/right heart failure. The fludrocortisone is also likely responsible for his hypernatremia and hypokalemia and all of this should improve once he discontinues the medication. If he becomes symptomatic from orthostatic hypotension this medicine should not be restarted but as mentioned above I would illuminate treatment with his alpha-blocker and loop diuretic and maybe even back down on the dose of carvedilol.

## 2013-08-04 NOTE — Assessment & Plan Note (Signed)
He has a remote history of a stent placed to the LAD artery in the year 2000 and has moderate disease in the branches of the left circumflex coronary artery by his most recent angiogram in 2006. A nuclear stress test performed in April of 2013 showed low risk findings without any reversible perfusion defects. He is asymptomatic.

## 2013-08-04 NOTE — Assessment & Plan Note (Signed)
He has extensive atherosclerotic changes with occlusion of the left internal carotid artery and mild plaque on the right as well as a small infrarenal abdominal aortic aneurysm and slowly enlarging bilateral common iliac artery aneurysms. These should be reevaluated in January.

## 2013-08-06 ENCOUNTER — Telehealth: Payer: Self-pay | Admitting: Internal Medicine

## 2013-08-06 NOTE — Telephone Encounter (Signed)
Pt had appt with Dr C 07-24-13/mt

## 2013-08-15 ENCOUNTER — Telehealth (HOSPITAL_COMMUNITY): Payer: Self-pay | Admitting: *Deleted

## 2013-09-23 ENCOUNTER — Telehealth (HOSPITAL_COMMUNITY): Payer: Self-pay | Admitting: *Deleted

## 2013-10-22 ENCOUNTER — Telehealth (HOSPITAL_COMMUNITY): Payer: Self-pay | Admitting: *Deleted

## 2013-10-24 ENCOUNTER — Telehealth (HOSPITAL_COMMUNITY): Payer: Self-pay | Admitting: *Deleted

## 2013-10-24 NOTE — Telephone Encounter (Addendum)
Please call patients son regarding remote pacer check that's scheduled for tomorrow. He wants to know how its done so he can help his dad in the morning. Please leave specifics on voice mail.

## 2013-10-24 NOTE — Telephone Encounter (Signed)
Message was left that nothing needs to be done to send transmission. Transmission received in past and pt has wireless device.

## 2013-10-25 ENCOUNTER — Ambulatory Visit (HOSPITAL_COMMUNITY)
Admission: RE | Admit: 2013-10-25 | Discharge: 2013-10-25 | Disposition: A | Discharge status: Home / Self Care | Payer: Medicare Other | Source: Ambulatory Visit | Attending: Internal Medicine | Admitting: Internal Medicine

## 2013-10-25 ENCOUNTER — Ambulatory Visit (INDEPENDENT_AMBULATORY_CARE_PROVIDER_SITE_OTHER): Payer: Medicare Other | Admitting: *Deleted

## 2013-10-25 DIAGNOSIS — I714 Abdominal aortic aneurysm, without rupture, unspecified: Secondary | ICD-10-CM | POA: Insufficient documentation

## 2013-10-25 DIAGNOSIS — I4891 Unspecified atrial fibrillation: Secondary | ICD-10-CM

## 2013-10-25 DIAGNOSIS — I723 Aneurysm of iliac artery: Secondary | ICD-10-CM | POA: Insufficient documentation

## 2013-10-25 LAB — MDC_IDC_ENUM_SESS_TYPE_REMOTE
Battery Remaining Longevity: 125 mo
Brady Statistic RV Percent Paced: 34 %
Date Time Interrogation Session: 20150123081924
Implantable Pulse Generator Model: 1210
Implantable Pulse Generator Serial Number: 7078932
Lead Channel Impedance Value: 490 Ohm
Lead Channel Pacing Threshold Amplitude: 0.5 V
Lead Channel Pacing Threshold Pulse Width: 0.4 ms
Lead Channel Sensing Intrinsic Amplitude: 6.4 mV
Lead Channel Setting Pacing Amplitude: 2.25 V
Lead Channel Setting Sensing Sensitivity: 2 mV
MDC IDC MSMT BATTERY VOLTAGE: 2.98 V
MDC IDC SET LEADCHNL RV PACING PULSEWIDTH: 0.4 ms

## 2013-10-25 LAB — PACEMAKER DEVICE OBSERVATION

## 2013-10-25 NOTE — Progress Notes (Signed)
Abdominal Aortic Duplex Completed °Brianna L Mazza,RVT °

## 2013-10-31 NOTE — Progress Notes (Signed)
No change in the size of his AAA or iliac artery aneurysms. Recheck US in 1 year

## 2013-11-06 ENCOUNTER — Encounter: Payer: Self-pay | Admitting: *Deleted

## 2013-11-12 ENCOUNTER — Telehealth: Payer: Self-pay | Admitting: *Deleted

## 2013-11-12 DIAGNOSIS — I714 Abdominal aortic aneurysm, without rupture, unspecified: Secondary | ICD-10-CM

## 2013-11-12 NOTE — Telephone Encounter (Signed)
Results of doppler study called to patient.  Voiced understanding.  Will recheck in one year.  Order placed.

## 2013-12-24 ENCOUNTER — Other Ambulatory Visit: Payer: Self-pay | Admitting: *Deleted

## 2013-12-24 MED ORDER — DIGOXIN 125 MCG PO TABS: ORAL_TABLET | ORAL | Status: DC

## 2013-12-24 NOTE — Telephone Encounter (Signed)
Rx. Refill sent to patients pharmacy.  

## 2014-02-12 ENCOUNTER — Encounter: Payer: Self-pay | Admitting: *Deleted

## 2014-04-01 ENCOUNTER — Other Ambulatory Visit: Payer: Self-pay | Admitting: *Deleted

## 2014-04-01 NOTE — Telephone Encounter (Signed)
Refill for Megace 40mg  refused - defer to PCP

## 2014-04-29 ENCOUNTER — Encounter: Payer: Self-pay | Admitting: Cardiovascular Disease

## 2014-04-29 ENCOUNTER — Ambulatory Visit (INDEPENDENT_AMBULATORY_CARE_PROVIDER_SITE_OTHER): Payer: Medicare Other | Admitting: Cardiovascular Disease

## 2014-04-29 VITALS — BP 110/60 | HR 92 | Resp 22 | Ht 68.0 in | Wt 162.3 lb

## 2014-04-29 DIAGNOSIS — I4891 Unspecified atrial fibrillation: Secondary | ICD-10-CM

## 2014-04-29 DIAGNOSIS — I714 Abdominal aortic aneurysm, without rupture, unspecified: Secondary | ICD-10-CM

## 2014-04-29 DIAGNOSIS — Z950 Presence of cardiac pacemaker: Secondary | ICD-10-CM

## 2014-04-29 DIAGNOSIS — I723 Aneurysm of iliac artery: Secondary | ICD-10-CM

## 2014-04-29 LAB — MDC_IDC_ENUM_SESS_TYPE_INCLINIC
Battery Remaining Longevity: 126 mo
Implantable Pulse Generator Model: 1210
Implantable Pulse Generator Serial Number: 7078932
Lead Channel Impedance Value: 487.5 Ohm
Lead Channel Pacing Threshold Pulse Width: 0.4 ms
Lead Channel Pacing Threshold Pulse Width: 0.4 ms
Lead Channel Setting Pacing Amplitude: 2.25 V
Lead Channel Setting Pacing Pulse Width: 0.4 ms
MDC IDC MSMT BATTERY VOLTAGE: 2.96 V
MDC IDC MSMT LEADCHNL RV PACING THRESHOLD AMPLITUDE: 0.75 V
MDC IDC MSMT LEADCHNL RV PACING THRESHOLD AMPLITUDE: 0.75 V
MDC IDC MSMT LEADCHNL RV SENSING INTR AMPL: 6.7 mV
MDC IDC SESS DTM: 20150728134253
MDC IDC SET LEADCHNL RV SENSING SENSITIVITY: 2 mV
MDC IDC STAT BRADY RV PERCENT PACED: 31 %

## 2014-04-29 NOTE — Patient Instructions (Signed)
Remote monitoring is used to monitor your Pacemaker of ICD from home. This monitoring reduces the number of office visits required to check your device to one time per year. It allows us to keep an eye on the functioning of your device to ensure it is working properly. You are scheduled for a device check from home on 07/31/2014. You may send your transmission at any time that day. If you have a wireless device, the transmission will be sent automatically. After your physician reviews your transmission, you will receive a postcard with your next transmission date.  Your physician wants you to follow-up in: 6 months with Dr. Royann Shiversroitoru. You will receive a reminder letter in the mail two months in advance. If you don't receive a letter, please call our office to schedule the follow-up appointment.

## 2014-04-30 ENCOUNTER — Encounter: Payer: Self-pay | Admitting: Cardiovascular Disease

## 2014-04-30 DIAGNOSIS — I714 Abdominal aortic aneurysm, without rupture, unspecified: Secondary | ICD-10-CM | POA: Insufficient documentation

## 2014-04-30 DIAGNOSIS — I723 Aneurysm of iliac artery: Secondary | ICD-10-CM | POA: Insufficient documentation

## 2014-04-30 NOTE — Progress Notes (Signed)
Patient ID: Willie [name], male   DOB: 08-12-1928, 80 y.o.   MRN: [id number]     Reason for office visit Pacemaker check, atrial fibrillation, AAA  Willie [name] gets dizzy occasionally when he gets out of his truck. He has not had syncope and denies worsening dyspnea.. His appetite is poor. He has started Megace and is receiving prednisone.  Willie [name] is an elderly gentleman with advanced chronic lung disease, who follows up with Dr. Baird Lyons. He has numerous cardiac and vascular problems that include coronary disease, carotid occlusion, abdominal aortic aneurysm, permanent atrial fibrillation and a history of bradycardia for which he initially received a pacemaker in 1991 (last generator change 2010). A stent was placed in his LAD artery in the year 2000 and has not had any new coronary event since. He had a low risk nuclear stress test about a year ago. He does not have angina pectoris. Both by the nuclear test and an echocardiogram performed in July of 2013 he has virtually normal left ventricular systolic function (borderline LVEF secondary to a synchronous septal motion. He has a relatively small AAA (4.1 cm, stable in size, last scan Jan 2015) and bilateral common iliac aneurysms (right 3.1x3.0 cm, left 2.3x2.3 cm) His pacemaker is functioning normally. He only paces the ventricle approximately 30% of the time. His dual-chamber device is programmed to VVIR for permanent atrial fibrillation. He takes warfarin anticoagulation and has not had a recent problems with bleeding.  Allergies  Allergen Reactions  . Sulfonamide Derivatives     REACTION: Swelling of jaws and had welps    Current Outpatient Prescriptions  Medication Sig Dispense Refill  . albuterol (PROAIR HFA) 108 (90 BASE) MCG/ACT inhaler Inhale 2 puffs into the lungs 4 (four) times daily as needed. For shortness of breath      . aspirin EC 81 MG tablet Take 1 tablet (81 mg total) by mouth daily.      Marland Kitchen atorvastatin  (LIPITOR) 20 MG tablet Take 20 mg by mouth daily.        . cephALEXin (KEFLEX) 500 MG capsule Take 500 mg by mouth 2 (two) times daily.      . colchicine-probenecid 0.5-500 MG per tablet Take 1 tablet by mouth Twice daily.      . digoxin (LANOXIN) 0.125 MG tablet Take 1/2 tablet daily  20 tablet  11  . ferrous sulfate 325 (65 FE) MG EC tablet Take 325 mg by mouth daily with breakfast.      . furosemide (LASIX) 20 MG tablet Take 20 mg by mouth daily as needed.       . Indacaterol Maleate (ARCAPTA NEOHALER) 75 MCG CAPS Place 1 capsule into inhaler and inhale daily.  30 capsule  3  . levalbuterol (XOPENEX) 0.63 MG/3ML nebulizer solution daily.      . megestrol (MEGACE) 40 MG/ML suspension as directed.      . potassium chloride (K-DUR) 10 MEQ tablet Take 10 mEq by mouth daily.      . predniSONE (DELTASONE) 5 MG tablet Take 5-10 mg by mouth 2 (two) times daily with a meal.      . probenecid (BENEMID) 500 MG tablet Take 500 mg by mouth 2 (two) times daily.        . SYMBICORT 160-4.5 MCG/ACT inhaler as needed.      . Tamsulosin HCl (FLOMAX) 0.4 MG CAPS Take 0.4 mg by mouth 2 (two) times daily.       Marland Kitchen warfarin (COUMADIN) 2 MG  tablet Take 1.5 tablets by mouth daily as directed.  Need blood work for further refills  45 tablet  0  . XOPENEX HFA 45 MCG/ACT inhaler daily.      . formoterol (FORADIL) 12 MCG capsule for inhaler Place 1 capsule (12 mcg total) into inhaler and inhale 2 (two) times daily.  60 capsule  3   No current facility-administered medications for this visit.    Past Medical History  Diagnosis Date  . Osteoarthrosis, unspecified whether generalized or localized, unspecified site   . Coronary atherosclerosis of unspecified type of vessel, native or graft   . Chronic rhinitis   . Chronic airway obstruction, not elsewhere classified   . Atrial fibrillation     chronic-coumadin , VVIR pacemaker-Dr. Rollene Fare  . Gout     Dr Tana Coast  . Chronic kidney disease     mild renal  insufficiency  . Shortness of breath   . Hypertension   . Myocardial infarction   . Recurrent upper respiratory infection (URI)     cold 2 weeks ago  . Pneumonia     10 years ago  . Anemia   . H/O hiatal hernia   . Depression   . Angina     Past Surgical History  Procedure Laterality Date  . Pacemaker insertion    . Pacemaker insertion  about 36 years ago  . Tonsillectomy    . Coccyx removal  24 yr ago    Family History  Problem Relation Age of Onset  . COPD Brother   . Cancer Father   . Stroke Mother     History   Social History  . Marital Status: Married    Spouse Name: N/A    Number of Children: N/A  . Years of Education: N/A   Occupational History  . OWNER     self employeed-hog farmer   Social History Main Topics  . Smoking status: Current Every Day Smoker    Types: Cigarettes  . Smokeless tobacco: Current User    Types: Chew     Comment: smokes 1-2 cigarette a day  . Alcohol Use: Yes     Comment: occasional  . Drug Use: No  . Sexual Activity: Not on file   Other Topics Concern  . Not on file   Social History Narrative  . No narrative on file    Review of systems: The patient specifically denies any chest pain at rest or with exertion, dyspnea at rest (but becomes dyspneic with minimal activity), orthopnea, paroxysmal nocturnal dyspnea, syncope, palpitations, focal neurological deficits, intermittent claudication, lower extremity edema, unexplained weight gain. He has a chronic cough, with production of mucoid sputum, but no hemoptysis. He has frequent wheezing.  The patient denies abdominal pain, nausea, vomiting, dysphagia, diarrhea, constipation, polyuria, polydipsia, dysuria, hematuria, frequency, urgency, abnormal bleeding or bruising, fever, chills, unexpected weight changes, mood swings, change in skin or hair texture, change in voice quality, auditory or visual problems, allergic reactions or rashes, new musculoskeletal complaints other than  usual "aches and pains".  He has not had any recent problems with presyncope/syncope   PHYSICAL EXAM BP 110/60  Pulse 92  Resp 22  Ht $R'5\' 8"'IF$  (1.727 m)  Wt 162 lb 4.8 oz (73.619 kg)  BMI 24.68 kg/m2 General: Alert, oriented x3, no distress  Head: no evidence of trauma, PERRL, EOMI, no exophtalmos or lid lag, no myxedema, no xanthelasma; normal ears, nose and oropharynx  Neck: non-elevated jugular venous pulsations and no hepatojugular reflux, but  there is a prominent V wave; brisk carotid pulses without delay and no carotid bruits. Even the left carotid pulse appears to have flow volume  Chest: He has a barrel chest and diminished breath sounds throughout , hyperresonant to percussion, no signs of consolidation by percussion or palpation, normal fremitus, symmetrical and full respiratory excursions. Wheezes are heard only on full forced expiration  Cardiovascular: normal position and quality of the apical impulse, irregular rhythm, normal first and second heart sounds, no rubs or gallops. Grade 2-6/9 all systolic murmur at the left lower sternal border  Abdomen: no tenderness or distention, no masses by palpation, no abnormal pulsatility or arterial bruits, normal bowel sounds, no hepatosplenomegaly  Extremities: no clubbing, cyanosis; 2-3+ edema; 2+ radial, ulnar and brachial pulses bilaterally; 2+ right femoral, posterior tibial and dorsalis pedis pulses; 2+ left femoral, posterior tibial and dorsalis pedis pulses; no subclavian or femoral bruits  Neurological: grossly nonfocal   EKG: atrial fibrillation, intermittent V pacing  Lipid Panel  No results found for this basename: chol, trig, hdl, cholhdl, vldl, ldlcalc    BMET    Component Value Date/Time   NA 147* 09/12/2008 1350   K 4.9 09/12/2008 1350   CL 101 09/12/2008 1350   CO2 35* 09/12/2008 1350   GLUCOSE 126* 09/12/2008 1350   BUN 17 09/12/2008 1350   CREATININE 1.24 09/12/2008 1350   CALCIUM 9.7 09/12/2008 1350   GFRNONAA  56* 09/12/2008 1350   GFRAA  Value: >60        The eGFR has been calculated using the MDRD equation. This calculation has not been validated in all clinical 09/12/2008 1350     ASSESSMENT AND PLAN No problem-specific assessment & plan notes found for this encounter. Permanent atrial fibrillation  Good rate control. On appropriate warfarin anticoagulation. As far as I know he has not had a stroke, TIA or other embolic events in the past.  CORONARY ARTERY DISEASE  He has a remote history of a stent placed to the LAD artery in the year 2000 and has moderate disease in the branches of the left circumflex coronary artery by his most recent angiogram in 2006. A nuclear stress test performed in April of 2013 showed low risk findings without any reversible perfusion defects.  He is asymptomatic.  CHRONIC OBSTRUCTIVE PULMONARY DISEASE  This appears to be his major cause for functional impairment, although it is hard to separate out any component of cardiac disease. There is evidence of moderate pulmonary hypertension and severe tricuspid insufficiency, most likely related to cor pulmonale. Since he has atrial fibrillation his echo was not very reliable to show whether he has elevated left heart pressures.  PAD (peripheral artery disease)  He has extensive atherosclerotic changes with occlusion of the left internal carotid artery and mild plaque on the right as well as a small infrarenal abdominal aortic aneurysm and slowly enlarging bilateral common iliac artery aneurysms. These should be reevaluated yearly (due January 2016). Pacemaker - [employer] Accent DR RF 2010 (initial pacemaker implant 1991), programmed VVIR  Pacemaker check in clinic:normal device function. No high ventricular rates noted. Patient will follow up remotely in 6 months and with MD in 1 year.  Orthostatic hypotension  Dizziness is not a big complaint at this time, even after we stopped the fludrocortisone. Bilateral leg edema    Improved off fludrocortisone   Patient Instructions  Remote monitoring is used to monitor your Pacemaker of ICD from home. This monitoring reduces the number of office visits  required to check your device to one time per year. It allows [place] to keep an eye on the functioning of your device to ensure it is working properly. You are scheduled for a device check from home on 07/31/2014. You may send your transmission at any time that day. If you have a wireless device, the transmission will be sent automatically. After your physician reviews your transmission, you will receive a postcard with your next transmission date.  Your physician wants you to follow-up in: 6 months with Dr. Sallyanne Kuster. You will receive a reminder letter in the mail two months in advance. If you don't receive a letter, please call our office to schedule the follow-up appointment.      Orders Placed This Encounter  Procedures  . Implantable device check   Meds ordered this encounter  Medications  . ferrous sulfate 325 (65 FE) MG EC tablet    Sig: Take 325 mg by mouth daily with breakfast.  . predniSONE (DELTASONE) 5 MG tablet    Sig: Take 5-10 mg by mouth 2 (two) times daily with a meal.  . cephALEXin (KEFLEX) 500 MG capsule    Sig: Take 500 mg by mouth 2 (two) times daily.  . megestrol (MEGACE) 40 MG/ML suspension    Sig: as directed.    Holli Humbles, MD, [place] [phone] office [phone] pager

## 2014-06-16 ENCOUNTER — Encounter: Payer: Self-pay | Admitting: Cardiovascular Disease

## 2014-07-31 ENCOUNTER — Ambulatory Visit (INDEPENDENT_AMBULATORY_CARE_PROVIDER_SITE_OTHER): Payer: Medicare Other | Admitting: *Deleted

## 2014-07-31 DIAGNOSIS — I4891 Unspecified atrial fibrillation: Secondary | ICD-10-CM

## 2014-07-31 NOTE — Progress Notes (Signed)
Remote pacemaker transmission.   

## 2014-08-03 DIAGNOSIS — I255 Ischemic cardiomyopathy: Secondary | ICD-10-CM

## 2014-08-03 HISTORY — DX: Ischemic cardiomyopathy: I25.5

## 2014-08-05 ENCOUNTER — Inpatient Hospital Stay (HOSPITAL_COMMUNITY)
Admission: AD | Admit: 2014-08-05 | Discharge: 2014-08-15 | DRG: 314 | Disposition: A | Discharge status: Skilled Nursing Facility | Payer: Medicare Other | Source: Other Acute Inpatient Hospital | Attending: Cardiology | Admitting: Cardiology

## 2014-08-05 ENCOUNTER — Other Ambulatory Visit: Payer: Self-pay

## 2014-08-05 DIAGNOSIS — Z9119 Patient's noncompliance with other medical treatment and regimen: Secondary | ICD-10-CM | POA: Diagnosis present

## 2014-08-05 DIAGNOSIS — Y712 Prosthetic and other implants, materials and accessory cardiovascular devices associated with adverse incidents: Secondary | ICD-10-CM | POA: Diagnosis present

## 2014-08-05 DIAGNOSIS — T82598A Other mechanical complication of other cardiac and vascular devices and implants, initial encounter: Principal | ICD-10-CM | POA: Diagnosis present

## 2014-08-05 DIAGNOSIS — Z7952 Long term (current) use of systemic steroids: Secondary | ICD-10-CM

## 2014-08-05 DIAGNOSIS — J189 Pneumonia, unspecified organism: Secondary | ICD-10-CM | POA: Diagnosis present

## 2014-08-05 DIAGNOSIS — J449 Chronic obstructive pulmonary disease, unspecified: Secondary | ICD-10-CM | POA: Diagnosis present

## 2014-08-05 DIAGNOSIS — I951 Orthostatic hypotension: Secondary | ICD-10-CM | POA: Diagnosis present

## 2014-08-05 DIAGNOSIS — N183 Chronic kidney disease, stage 3 unspecified: Secondary | ICD-10-CM | POA: Diagnosis present

## 2014-08-05 DIAGNOSIS — I255 Ischemic cardiomyopathy: Secondary | ICD-10-CM | POA: Diagnosis present

## 2014-08-05 DIAGNOSIS — I482 Chronic atrial fibrillation: Secondary | ICD-10-CM | POA: Diagnosis present

## 2014-08-05 DIAGNOSIS — Z955 Presence of coronary angioplasty implant and graft: Secondary | ICD-10-CM | POA: Diagnosis not present

## 2014-08-05 DIAGNOSIS — Z9981 Dependence on supplemental oxygen: Secondary | ICD-10-CM

## 2014-08-05 DIAGNOSIS — F17220 Nicotine dependence, chewing tobacco, uncomplicated: Secondary | ICD-10-CM | POA: Diagnosis present

## 2014-08-05 DIAGNOSIS — M419 Scoliosis, unspecified: Secondary | ICD-10-CM | POA: Diagnosis present

## 2014-08-05 DIAGNOSIS — Z7901 Long term (current) use of anticoagulants: Secondary | ICD-10-CM

## 2014-08-05 DIAGNOSIS — I739 Peripheral vascular disease, unspecified: Secondary | ICD-10-CM | POA: Diagnosis present

## 2014-08-05 DIAGNOSIS — Z9861 Coronary angioplasty status: Secondary | ICD-10-CM

## 2014-08-05 DIAGNOSIS — J440 Chronic obstructive pulmonary disease with acute lower respiratory infection: Secondary | ICD-10-CM | POA: Diagnosis present

## 2014-08-05 DIAGNOSIS — R001 Bradycardia, unspecified: Secondary | ICD-10-CM | POA: Diagnosis present

## 2014-08-05 DIAGNOSIS — R531 Weakness: Secondary | ICD-10-CM | POA: Diagnosis present

## 2014-08-05 DIAGNOSIS — D696 Thrombocytopenia, unspecified: Secondary | ICD-10-CM | POA: Diagnosis present

## 2014-08-05 DIAGNOSIS — J9620 Acute and chronic respiratory failure, unspecified whether with hypoxia or hypercapnia: Secondary | ICD-10-CM | POA: Diagnosis present

## 2014-08-05 DIAGNOSIS — E875 Hyperkalemia: Secondary | ICD-10-CM | POA: Diagnosis not present

## 2014-08-05 DIAGNOSIS — R059 Cough, unspecified: Secondary | ICD-10-CM

## 2014-08-05 DIAGNOSIS — I2510 Atherosclerotic heart disease of native coronary artery without angina pectoris: Secondary | ICD-10-CM

## 2014-08-05 DIAGNOSIS — I4891 Unspecified atrial fibrillation: Secondary | ICD-10-CM

## 2014-08-05 DIAGNOSIS — Z79899 Other long term (current) drug therapy: Secondary | ICD-10-CM | POA: Diagnosis not present

## 2014-08-05 DIAGNOSIS — Z7982 Long term (current) use of aspirin: Secondary | ICD-10-CM

## 2014-08-05 DIAGNOSIS — I714 Abdominal aortic aneurysm, without rupture, unspecified: Secondary | ICD-10-CM | POA: Diagnosis present

## 2014-08-05 DIAGNOSIS — I5043 Acute on chronic combined systolic (congestive) and diastolic (congestive) heart failure: Secondary | ICD-10-CM | POA: Diagnosis present

## 2014-08-05 DIAGNOSIS — F17210 Nicotine dependence, cigarettes, uncomplicated: Secondary | ICD-10-CM | POA: Diagnosis present

## 2014-08-05 DIAGNOSIS — I129 Hypertensive chronic kidney disease with stage 1 through stage 4 chronic kidney disease, or unspecified chronic kidney disease: Secondary | ICD-10-CM | POA: Diagnosis present

## 2014-08-05 DIAGNOSIS — Z950 Presence of cardiac pacemaker: Secondary | ICD-10-CM

## 2014-08-05 DIAGNOSIS — R05 Cough: Secondary | ICD-10-CM

## 2014-08-05 DIAGNOSIS — I252 Old myocardial infarction: Secondary | ICD-10-CM | POA: Diagnosis not present

## 2014-08-05 DIAGNOSIS — I5033 Acute on chronic diastolic (congestive) heart failure: Secondary | ICD-10-CM

## 2014-08-05 HISTORY — DX: Abdominal aortic aneurysm, without rupture: I71.4

## 2014-08-05 HISTORY — DX: Sick sinus syndrome: I49.5

## 2014-08-05 HISTORY — DX: Long term (current) use of anticoagulants: Z79.01

## 2014-08-05 HISTORY — DX: Ischemic cardiomyopathy: I25.5

## 2014-08-05 HISTORY — DX: Abdominal aortic aneurysm, without rupture, unspecified: I71.40

## 2014-08-05 LAB — GLUCOSE, CAPILLARY: Glucose-Capillary: 103 mg/dL — ABNORMAL HIGH (ref 70–99)

## 2014-08-05 LAB — MRSA PCR SCREENING: MRSA BY PCR: NEGATIVE

## 2014-08-05 MED ORDER — SODIUM CHLORIDE 0.9 % IJ SOLN
3.0000 mL | Freq: Two times a day (BID) | INTRAMUSCULAR | Status: DC
  Administered 2014-08-06 – 2014-08-14 (×16): 3 mL via INTRAVENOUS

## 2014-08-05 MED ORDER — ACETAMINOPHEN 325 MG PO TABS
650.0000 mg | ORAL_TABLET | ORAL | Status: DC | PRN
  Administered 2014-08-10: 650 mg via ORAL
  Filled 2014-08-05: qty 2

## 2014-08-05 MED ORDER — WARFARIN SODIUM 2 MG PO TABS
2.0000 mg | ORAL_TABLET | Freq: Every day | ORAL | Status: DC
  Administered 2014-08-06: 2 mg via ORAL
  Filled 2014-08-05 (×2): qty 1

## 2014-08-05 MED ORDER — ASPIRIN EC 81 MG PO TBEC
81.0000 mg | DELAYED_RELEASE_TABLET | Freq: Every day | ORAL | Status: DC
  Administered 2014-08-06 – 2014-08-15 (×10): 81 mg via ORAL
  Filled 2014-08-05 (×10): qty 1

## 2014-08-05 MED ORDER — BUDESONIDE-FORMOTEROL FUMARATE 160-4.5 MCG/ACT IN AERO
2.0000 | INHALATION_SPRAY | Freq: Two times a day (BID) | RESPIRATORY_TRACT | Status: DC
  Filled 2014-08-05: qty 6

## 2014-08-05 MED ORDER — CETYLPYRIDINIUM CHLORIDE 0.05 % MT LIQD
7.0000 mL | Freq: Two times a day (BID) | OROMUCOSAL | Status: DC
  Administered 2014-08-06 – 2014-08-15 (×17): 7 mL via OROMUCOSAL

## 2014-08-05 MED ORDER — DIGOXIN 0.0625 MG HALF TABLET
0.0625 mg | ORAL_TABLET | Freq: Every day | ORAL | Status: DC
  Administered 2014-08-06: 0.0625 mg via ORAL
  Filled 2014-08-05 (×2): qty 1

## 2014-08-05 MED ORDER — FUROSEMIDE 10 MG/ML IJ SOLN
20.0000 mg | Freq: Two times a day (BID) | INTRAMUSCULAR | Status: DC
  Filled 2014-08-05 (×2): qty 2

## 2014-08-05 MED ORDER — SODIUM CHLORIDE 0.9 % IV SOLN: 250.0000 mL | INTRAVENOUS | Status: DC | PRN

## 2014-08-05 MED ORDER — SODIUM CHLORIDE 0.9 % IJ SOLN
3.0000 mL | INTRAMUSCULAR | Status: DC | PRN
  Administered 2014-08-10 (×2): 3 mL via INTRAVENOUS
  Filled 2014-08-05 (×2): qty 3

## 2014-08-05 MED ORDER — TAMSULOSIN HCL 0.4 MG PO CAPS
0.4000 mg | ORAL_CAPSULE | Freq: Two times a day (BID) | ORAL | Status: DC
  Administered 2014-08-06: 0.4 mg via ORAL
  Filled 2014-08-05 (×3): qty 1

## 2014-08-05 MED ORDER — PREDNISONE 5 MG PO TABS
5.0000 mg | ORAL_TABLET | Freq: Two times a day (BID) | ORAL | Status: DC
  Administered 2014-08-06 – 2014-08-12 (×13): 5 mg via ORAL
  Filled 2014-08-05 (×16): qty 1

## 2014-08-05 MED ORDER — BUDESONIDE-FORMOTEROL FUMARATE 160-4.5 MCG/ACT IN AERO
2.0000 | INHALATION_SPRAY | Freq: Two times a day (BID) | RESPIRATORY_TRACT | Status: DC
  Administered 2014-08-06 – 2014-08-15 (×19): 2 via RESPIRATORY_TRACT

## 2014-08-05 MED ORDER — LEVALBUTEROL HCL 0.63 MG/3ML IN NEBU
0.6300 mg | INHALATION_SOLUTION | Freq: Four times a day (QID) | RESPIRATORY_TRACT | Status: DC | PRN
  Administered 2014-08-06 – 2014-08-12 (×11): 0.63 mg via RESPIRATORY_TRACT
  Filled 2014-08-05 (×11): qty 3

## 2014-08-05 MED ORDER — ATORVASTATIN CALCIUM 20 MG PO TABS
20.0000 mg | ORAL_TABLET | Freq: Every day | ORAL | Status: DC
  Administered 2014-08-06 – 2014-08-15 (×10): 20 mg via ORAL
  Filled 2014-08-05 (×10): qty 1

## 2014-08-05 MED ORDER — POTASSIUM CHLORIDE ER 10 MEQ PO TBCR
10.0000 meq | EXTENDED_RELEASE_TABLET | Freq: Every day | ORAL | Status: DC
  Administered 2014-08-06: 10 meq via ORAL
  Filled 2014-08-05 (×3): qty 1

## 2014-08-05 NOTE — H&P (Signed)
History and Physical  Patient ID: Willie [name] MRN: [id number], SOB: [birth year] 80 y.o. Date of Encounter: 08/05/2014, 9:28 PM  Primary Physician: Coralie KeensNewsome, Samuel, MD Primary Cardiologist: Dr. Royann Shiversroitoru  Chief Complaint: weakness, concern for pacemaker failure  HPI: 80 y.o. male w/ PMHx significant for chronic afib and bradycardia requiring pacemaker, CAD s/p LAD stent 2000, severe COPD on home 02,  who [facility] of [facility] and [facility] and was subsequent [facility] due to concerns of pacer failure. Apparently had monitored bradycardia to the 30s.   He presents with minimal records from the outside hospital (no labs, no ekg, no telemetry strip, med list is ?incorrect). In speaking with the patient and his son by telephone, he was recently discharged from hospital there 2 weeks ago. Over the last week or so, has had increasing weakness, shortness of breath and increased LE edema. Pt is a rather poor historian but states no chest pain. Doesn;t wear 02 at home like he is supposed to. Unsure about weight gain. There is quite a bit of medication confusion as the list from the outside hospital is different that our list (?metoprolol) and pt is unable to clarify. He states that he is taking the digoxin, lasix, and coumadin when directly asked about these.  No fever, chills. Chronic shortness of breath is worse.  During the ER visit, had problems with afib with RVR and received 3 x 10 mg diltiazem.   Nurse noted that he was dipping tobacco upon transfer here.   Outside cxray interpretation shows stable COPD and fibrotic changes. 3 leads of pacer (?old lead?) On telemetry, in afib going 130s. No current complaints.   Past Medical History  Diagnosis Date  . Osteoarthrosis, unspecified whether generalized or localized, unspecified site   . Coronary atherosclerosis of unspecified type of vessel, native or graft   . Chronic rhinitis   .  Chronic airway obstruction, not elsewhere classified   . Atrial fibrillation     chronic-coumadin , VVIR pacemaker-Dr. Alanda AmassWeintraub  . Gout     Dr Novella Robrulsow  . Chronic kidney disease     mild renal insufficiency  . Shortness of breath   . Hypertension   . Myocardial infarction   . Recurrent upper respiratory infection (URI)     cold 2 weeks ago  . Pneumonia     10 years ago  . Anemia   . H/O hiatal hernia   . Depression   . Angina      Surgical History:  Past Surgical History  Procedure Laterality Date  . Pacemaker insertion    . Pacemaker insertion  about 36 years ago  . Tonsillectomy    . Coccyx removal  24 yr ago     Home Meds: unable to clarify. Prior to Admission medications   Medication Sig Start Date End Date Taking? Authorizing Provider  albuterol (PROAIR HFA) 108 (90 BASE) MCG/ACT inhaler Inhale 2 puffs into the lungs 4 (four) times daily as needed. For shortness of breath    Historical Provider, MD  aspirin EC 81 MG tablet Take 1 tablet (81 mg total) by mouth daily. 10/20/11   Jerilee FieldMatthew Eskridge, MD  atorvastatin (LIPITOR) 20 MG tablet Take 20 mg by mouth daily.      Historical Provider, MD  cephALEXin (KEFLEX) 500 MG capsule Take 500 mg by mouth 2 (two) times daily.    Historical Provider, MD  colchicine-probenecid 0.5-500 MG per tablet Take 1 tablet by  mouth Twice daily. 03/05/12   Historical Provider, MD  digoxin (LANOXIN) 0.125 MG tablet Take 1/2 tablet daily 12/24/13   Thurmon Fair, MD  ferrous sulfate 325 (65 FE) MG EC tablet Take 325 mg by mouth daily with breakfast.    Historical Provider, MD  formoterol (FORADIL) 12 MCG capsule for inhaler Place 1 capsule (12 mcg total) into inhaler and inhale 2 (two) times daily. 02/08/12 02/07/13  Waymon Budge, MD  furosemide (LASIX) 20 MG tablet Take 20 mg by mouth daily as needed.     Historical Provider, MD  Indacaterol Maleate (ARCAPTA NEOHALER) 75 MCG CAPS Place 1 capsule into inhaler and inhale daily. 11/22/12   Waymon Budge, MD  levalbuterol Pauline Aus) 0.63 MG/3ML nebulizer solution daily. 07/10/13   Historical Provider, MD  megestrol (MEGACE) 40 MG/ML suspension as directed. 03/31/14   Historical Provider, MD  potassium chloride (K-DUR) 10 MEQ tablet Take 10 mEq by mouth daily. 07/10/13   Historical Provider, MD  predniSONE (DELTASONE) 5 MG tablet Take 5-10 mg by mouth 2 (two) times daily with a meal.    Historical Provider, MD  probenecid (BENEMID) 500 MG tablet Take 500 mg by mouth 2 (two) times daily.      Historical Provider, MD  SYMBICORT 160-4.5 MCG/ACT inhaler as needed. 07/10/13   Historical Provider, MD  Tamsulosin HCl (FLOMAX) 0.4 MG CAPS Take 0.4 mg by mouth 2 (two) times daily.     Historical Provider, MD  warfarin (COUMADIN) 2 MG tablet Take 1.5 tablets by mouth daily as directed.  Need blood work for further refills 03/25/13   Phillips Hay, RPH-CPP  XOPENEX HFA 45 MCG/ACT inhaler daily. 07/10/13   Historical Provider, MD    Allergies:  Allergies  Allergen Reactions  . Sulfonamide Derivatives     REACTION: Swelling of jaws and had welps    History   Social History  . Marital Status: Married    Spouse Name: N/A    Number of Children: N/A  . Years of Education: N/A   Occupational History  . OWNER     self employeed-hog farmer   Social History Main Topics  . Smoking status: Current Every Day Smoker    Types: Cigarettes  . Smokeless tobacco: Current User    Types: Chew     Comment: smokes 1-2 cigarette a day  . Alcohol Use: Yes     Comment: occasional  . Drug Use: No  . Sexual Activity: Not on file   Other Topics Concern  . Not on file   Social History Narrative  . No narrative on file     Family History  Problem Relation Age of Onset  . COPD Brother   . Cancer Father   . Stroke Mother     Review of Systems: General: negative for chills, fever, night sweats or weight changes.  Cardiovascular: see HPI Dermatological: negative for rash Respiratory: negative for cough  or wheezing Urologic: negative for hematuria Abdominal: negative for nausea, vomiting, diarrhea, bright red blood per rectum, melena, or hematemesis Neurologic: negative for visual changes, syncope, or dizziness All other systems reviewed and are otherwise negative except as noted above.  Labs:  pending  Radiology/Studies:   see HPI for outside cxray intep.  EKG: pending  Telemetry: pacer appropriately firing at what appears to when rate < 60 BPM. No missed beats.  Physical Exam: Blood pressure 96/63, pulse 69, temperature 98.1 F (36.7 C), temperature source Oral, resp. rate 20, weight 72.8 kg (160 lb  7.9 oz), SpO2 98 %. General: Well developed, well nourished, in no acute distress. Head: Normocephalic, atraumatic, sclera non-icteric, nares are without discharge Neck: Supple. Negative for carotid bruits. JVD not well visualized Lungs: minimal air movement diffusely, no wheezing Heart: irreg and tachy Abdomen: Soft, non-tender, non-distended with normoactive bowel sounds. No rebound/guarding. No obvious abdominal masses. Msk:  Strength and tone appear normal for age. Extremities: +2 pitting edema to knees. Ext warm and dry. Neuro: Alert and oriented X 3. Moves all extremities spontaneously. Hard of hearing. Psych:  Responds to questions appropriately with a normal affect.    ASSESSMENT AND PLAN:  Problem List 1. Mild acute on chronic diastolic heart failure, likely rate related 2. Afib with RVR 3. Question history of pacer malfunction, no evidence of telemetry 4. Severe COPD, on home 02 5. Chronic anticoagulation 6. CAD s/p stent 7. PVD  80 y.o. male w/ PMHx significant for chronic afib and bradycardia requiring pacemaker, CAD s/p LAD stent 2000, severe COPD on home 02,  who [facility] of [facility] and [facility] and was subsequent [facility] to  [facility] on 08/05/2014 due to concerns of pacer failure.  No evidence of pacer malfunction by  telemetry. Plan to have device interrogated in the AM. Additionally, pt and family appear to need assistance with how to do a remote transfer of pacer data. Hold on 2 view cxray to look for lead malfunction unless there is more clear evidence of pacer loss.  Afib with poor rate control. Likely is contributing to his symptoms of SOB and fatigue and exercise intolerance. According to past notes, he has been rate controlled on digoxin. Unclear what is driving his RVR. Awaiting labs for further workup. Continue digoxin for now, check level. Will use IV dilt if needed though pressure is rather marginal. Continue coumadin, check INR.  Based upon history and LE edema on exam, he reports what sounds like a component of fluid overload. Again, unclear why the decompensation (afib though chicken or the egg problem) but perhaps medication confusion and his recent admission with medication changes have led to fluid overload. Will gently diurese with IV lasix.  50% EF by last echo.  Continue his COPD regimen of xopenox, chronic steroids, and  Long acting agonists.  CAD and PVD: continue aspirin, statin.  Full code Cardiac diet. Fully anticoagulated.   Signed, Adolm JosephWHITLOCK, Troy SineMATTHEW C. MD 08/05/2014, 9:28 PM

## 2014-08-05 NOTE — Progress Notes (Signed)
[facility].Patient placed in room 2 c01 via staff.Patient placed on telemetry showing atrial fibrillation heart rate 120-140's.Patient denies pain or discomfort at present time.Dr.Whitlock.

## 2014-08-06 ENCOUNTER — Inpatient Hospital Stay (HOSPITAL_COMMUNITY): Payer: Medicare Other

## 2014-08-06 ENCOUNTER — Encounter (HOSPITAL_COMMUNITY): Payer: Self-pay | Admitting: *Deleted

## 2014-08-06 DIAGNOSIS — I951 Orthostatic hypotension: Secondary | ICD-10-CM

## 2014-08-06 DIAGNOSIS — I714 Abdominal aortic aneurysm, without rupture: Secondary | ICD-10-CM

## 2014-08-06 DIAGNOSIS — Z950 Presence of cardiac pacemaker: Secondary | ICD-10-CM

## 2014-08-06 DIAGNOSIS — I4891 Unspecified atrial fibrillation: Secondary | ICD-10-CM

## 2014-08-06 DIAGNOSIS — J449 Chronic obstructive pulmonary disease, unspecified: Secondary | ICD-10-CM

## 2014-08-06 LAB — CBC WITH DIFFERENTIAL/PLATELET
Basophils Absolute: 0.1 10*3/uL (ref 0.0–0.1)
Basophils Relative: 1 % (ref 0–1)
EOS PCT: 0 % (ref 0–5)
Eosinophils Absolute: 0 10*3/uL (ref 0.0–0.7)
HEMATOCRIT: 46.1 % (ref 39.0–52.0)
HEMOGLOBIN: 14.9 g/dL (ref 13.0–17.0)
Lymphocytes Relative: 6 % — ABNORMAL LOW (ref 12–46)
Lymphs Abs: 0.5 10*3/uL — ABNORMAL LOW (ref 0.7–4.0)
MCH: 36.7 pg — AB (ref 26.0–34.0)
MCHC: 32.3 g/dL (ref 30.0–36.0)
MCV: 113.5 fL — AB (ref 78.0–100.0)
MONO ABS: 1.2 10*3/uL — AB (ref 0.1–1.0)
MONOS PCT: 15 % — AB (ref 3–12)
NEUTROS ABS: 6.4 10*3/uL (ref 1.7–7.7)
Neutrophils Relative %: 78 % — ABNORMAL HIGH (ref 43–77)
PLATELETS: 95 10*3/uL — AB (ref 150–400)
RBC: 4.06 MIL/uL — ABNORMAL LOW (ref 4.22–5.81)
RDW: 16.3 % — ABNORMAL HIGH (ref 11.5–15.5)
WBC: 8.2 10*3/uL (ref 4.0–10.5)

## 2014-08-06 LAB — MAGNESIUM: Magnesium: 2.4 mg/dL (ref 1.5–2.5)

## 2014-08-06 LAB — BASIC METABOLIC PANEL
Anion gap: 9 (ref 5–15)
Anion gap: 9 (ref 5–15)
BUN: 39 mg/dL — ABNORMAL HIGH (ref 6–23)
BUN: 36 mg/dL — AB (ref 6–23)
CHLORIDE: 104 meq/L (ref 96–112)
CO2: 31 mEq/L (ref 19–32)
CO2: 30 mEq/L (ref 19–32)
Calcium: 8.7 mg/dL (ref 8.4–10.5)
Calcium: 8.4 mg/dL (ref 8.4–10.5)
Chloride: 105 mEq/L (ref 96–112)
Creatinine, Ser: 1.55 mg/dL — ABNORMAL HIGH (ref 0.50–1.35)
Creatinine, Ser: 1.46 mg/dL — ABNORMAL HIGH (ref 0.50–1.35)
GFR calc Af Amer: 49 mL/min — ABNORMAL LOW (ref >90)
GFR calc non Af Amer: 39 mL/min — ABNORMAL LOW (ref >90)
GFR, EST AFRICAN AMERICAN: 46 mL/min — AB (ref >90)
GFR, EST NON AFRICAN AMERICAN: 42 mL/min — AB (ref >90)
GLUCOSE: 75 mg/dL (ref 70–99)
Glucose, Bld: 85 mg/dL (ref 70–99)
POTASSIUM: 5.2 meq/L (ref 3.7–5.3)
Potassium: 5.2 mEq/L (ref 3.7–5.3)
Sodium: 145 mEq/L (ref 137–147)
Sodium: 143 mEq/L (ref 137–147)

## 2014-08-06 LAB — PRO B NATRIURETIC PEPTIDE: PRO B NATRI PEPTIDE: 3935 pg/mL — AB (ref 0–450)

## 2014-08-06 LAB — DIGOXIN LEVEL: Digoxin Level: 0.4 ng/mL — ABNORMAL LOW (ref 0.8–2.0)

## 2014-08-06 LAB — PROTIME-INR
INR: 3.04 — AB (ref 0.00–1.49)
INR: 3.24 — AB (ref 0.00–1.49)
Prothrombin Time: 31.7 seconds — ABNORMAL HIGH (ref 11.6–15.2)
Prothrombin Time: 33.3 seconds — ABNORMAL HIGH (ref 11.6–15.2)

## 2014-08-06 LAB — TROPONIN I: Troponin I: <0.3 ng/mL (ref <0.30)

## 2014-08-06 LAB — TSH: TSH: 1.8 u[IU]/mL (ref 0.350–4.500)

## 2014-08-06 LAB — CORTISOL: Cortisol, Plasma: 25.5 ug/dL

## 2014-08-06 MED ORDER — TAMSULOSIN HCL 0.4 MG PO CAPS
0.4000 mg | ORAL_CAPSULE | Freq: Every day | ORAL | Status: DC
  Administered 2014-08-06 – 2014-08-14 (×9): 0.4 mg via ORAL
  Filled 2014-08-06 (×10): qty 1

## 2014-08-06 MED ORDER — DIGOXIN 125 MCG PO TABS
0.1250 mg | ORAL_TABLET | Freq: Every day | ORAL | Status: DC
  Administered 2014-08-07 – 2014-08-15 (×9): 0.125 mg via ORAL
  Filled 2014-08-06 (×9): qty 1

## 2014-08-06 MED ORDER — DIGOXIN 0.25 MG/ML IJ SOLN
0.2500 mg | Freq: Every day | INTRAMUSCULAR | Status: AC
  Administered 2014-08-06: 0.25 mg via INTRAVENOUS
  Filled 2014-08-06: qty 1

## 2014-08-06 MED ORDER — DILTIAZEM HCL 100 MG IV SOLR
5.0000 mg | Freq: Once | INTRAVENOUS | Status: DC
  Filled 2014-08-06: qty 100

## 2014-08-06 MED ORDER — WARFARIN - PHARMACIST DOSING INPATIENT: Freq: Every day | Status: DC

## 2014-08-06 MED ORDER — FUROSEMIDE 10 MG/ML IJ SOLN
20.0000 mg | Freq: Every day | INTRAMUSCULAR | Status: DC
  Administered 2014-08-07: 20 mg via INTRAVENOUS
  Filled 2014-08-06 (×2): qty 2

## 2014-08-06 NOTE — Progress Notes (Signed)
DAILY PROGRESS NOTE  Subjective:  Weakness, fatigue for several weeks. Noted hypotension overnight. Per old notes, he has a history of orthostatic hypotension. Was previously on florinef, but removed. Takes chronic prednisone (? For COPD). On arrival in the room today, he had pulled out his IV and had a skin tear and was bleeding from both arms, which was controlled. He denies fever, cough, chills, etc. Labs show therapeutic INR.  No evidence for infection from OSH records.  Objective:  Temp:  [98.1 F (36.7 C)-98.4 F (36.9 C)] 98.1 F (36.7 C) (11/04 0731) Pulse Rate:  [46-158] 133 (11/04 0502) Resp:  [20-36] 29 (11/04 0502) BP: (83-113)/(53-85) 99/69 mmHg (11/04 0502) SpO2:  [96 %-100 %] 98 % (11/04 0735) FiO2 (%):  [28 %] 28 % (11/04 0735) Weight:  [159 lb 8 oz (72.349 kg)-160 lb 7.9 oz (72.8 kg)] 159 lb 8 oz (72.349 kg) (11/04 6144) Weight change:   Intake/Output from previous day: 11/03 0701 - 11/04 0700 In: 120 [P.O.:120] Out: 400 [Urine:400]  Intake/Output from this shift:    Medications: Current Facility-Administered Medications  Medication Dose Route Frequency Provider Last Rate Last Dose  . 0.9 %  sodium chloride infusion  250 mL Intravenous PRN Cletus Gash, MD      . acetaminophen (TYLENOL) tablet 650 mg  650 mg Oral Q4H PRN Cletus Gash, MD      . antiseptic oral rinse (CPC / CETYLPYRIDINIUM CHLORIDE 0.05%) solution 7 mL  7 mL Mouth Rinse BID Candee Furbish, MD   7 mL at 08/06/14 0039  . aspirin EC tablet 81 mg  81 mg Oral Daily Cletus Gash, MD      . atorvastatin (LIPITOR) tablet 20 mg  20 mg Oral Daily Cletus Gash, MD      . budesonide-formoterol [facility]) 160-4.5 MCG/ACT inhaler 2 puff  2 puff Inhalation BID Candee Furbish, MD   2 puff at 08/06/14 0732  . digoxin (LANOXIN) 0.25 MG/ML injection 0.25 mg  0.25 mg Intravenous Daily Pixie Casino, MD      . Derrill Memo ON 08/07/2014] digoxin (LANOXIN) tablet 0.125 mg  0.125 mg Oral Daily Pixie Casino, MD      . diltiazem (CARDIZEM) injection 5 mg  5 mg Intravenous Once Cletus Gash, MD   5 mg at 08/06/14 0420  . [START ON 08/07/2014] furosemide (LASIX) injection 20 mg  20 mg Intravenous Daily Pixie Casino, MD      . levalbuterol Penne Lash) nebulizer solution 0.63 mg  0.63 mg Nebulization Q6H PRN Cletus Gash, MD   0.63 mg at 08/06/14 0607  . potassium chloride (K-DUR) CR tablet 10 mEq  10 mEq Oral Daily Cletus Gash, MD      . predniSONE (DELTASONE) tablet 5-10 mg  5-10 mg Oral BID WC Cletus Gash, MD      . sodium chloride 0.9 % injection 3 mL  3 mL Intravenous Q12H Cletus Gash, MD   3 mL at 08/06/14 0039  . sodium chloride 0.9 % injection 3 mL  3 mL Intravenous PRN Cletus Gash, MD      . tamsulosin [facility]) capsule 0.4 mg  0.4 mg Oral QPC supper Pixie Casino, MD      . warfarin (COUMADIN) tablet 2 mg  2 mg Oral q1800 Cletus Gash, MD        Physical Exam: General appearance: alert and no distress Neck: no carotid bruit and no JVD Lungs: diminished breath sounds bilaterally Heart: irregularly irregular rhythm Abdomen: soft,  non-tender; bowel sounds normal; no masses,  no organomegaly Extremities: edema 1+ pedal edema bilaterally Pulses: 2+ and symmetric Skin: diffuse ecchymosis, skin tears Neurologic: Mental status: Awake, follows commands, but somewhat confused Psych: Appropriate  Lab Results: Results for orders placed or performed during the hospital encounter of 08/05/14 (from the past 48 hour(s))  MRSA PCR Screening     Status: None   Collection Time: 08/05/14  8:41 PM  Result Value Ref Range   MRSA by PCR NEGATIVE NEGATIVE    Comment:        The GeneXpert MRSA Assay (FDA approved for NASAL specimens only), is one component of a comprehensive MRSA colonization surveillance program. It is not intended to diagnose MRSA infection nor to guide or monitor treatment for MRSA infections.   Glucose, capillary     Status: Abnormal    Collection Time: 08/05/14 11:06 PM  Result Value Ref Range   Glucose-Capillary 103 (H) 70 - 99 mg/dL  CBC WITH DIFFERENTIAL     Status: Abnormal   Collection Time: 08/05/14 11:26 PM  Result Value Ref Range   WBC 8.2 4.0 - 10.5 K/uL   RBC 4.06 (L) 4.22 - 5.81 MIL/uL   Hemoglobin 14.9 13.0 - 17.0 g/dL   HCT 46.1 39.0 - 52.0 %   MCV 113.5 (H) 78.0 - 100.0 fL   MCH 36.7 (H) 26.0 - 34.0 pg   MCHC 32.3 30.0 - 36.0 g/dL   RDW 16.3 (H) 11.5 - 15.5 %   Platelets 95 (L) 150 - 400 K/uL    Comment: REPEATED TO VERIFY PLATELET COUNT CONFIRMED BY SMEAR    Neutrophils Relative % 78 (H) 43 - 77 %   Lymphocytes Relative 6 (L) 12 - 46 %   Monocytes Relative 15 (H) 3 - 12 %   Eosinophils Relative 0 0 - 5 %   Basophils Relative 1 0 - 1 %   Neutro Abs 6.4 1.7 - 7.7 K/uL   Lymphs Abs 0.5 (L) 0.7 - 4.0 K/uL   Monocytes Absolute 1.2 (H) 0.1 - 1.0 K/uL   Eosinophils Absolute 0.0 0.0 - 0.7 K/uL   Basophils Absolute 0.1 0.0 - 0.1 K/uL   RBC Morphology POLYCHROMASIA PRESENT    WBC Morphology ATYPICAL LYMPHOCYTES   Basic metabolic panel     Status: Abnormal   Collection Time: 08/05/14 11:26 PM  Result Value Ref Range   Sodium 145 137 - 147 mEq/L   Potassium 5.2 3.7 - 5.3 mEq/L   Chloride 105 96 - 112 mEq/L   CO2 31 19 - 32 mEq/L   Glucose, Bld 85 70 - 99 mg/dL   BUN 39 (H) 6 - 23 mg/dL   Creatinine, Ser 1.55 (H) 0.50 - 1.35 mg/dL   Calcium 8.7 8.4 - 10.5 mg/dL   GFR calc non Af Amer 39 (L) >90 mL/min   GFR calc Af Amer 46 (L) >90 mL/min    Comment: (NOTE) The eGFR has been calculated using the CKD EPI equation. This calculation has not been validated in all clinical situations. eGFR's persistently <90 mL/min signify possible Chronic Kidney Disease.    Anion gap 9 5 - 15  Protime-INR     Status: Abnormal   Collection Time: 08/05/14 11:26 PM  Result Value Ref Range   Prothrombin Time 31.7 (H) 11.6 - 15.2 seconds   INR 3.04 (H) 0.00 - 1.49  Magnesium     Status: None   Collection Time:  08/05/14 11:26 PM  Result Value Ref Range  Magnesium 2.4 1.5 - 2.5 mg/dL  Pro b natriuretic peptide (BNP)     Status: Abnormal   Collection Time: 08/05/14 11:26 PM  Result Value Ref Range   Pro B Natriuretic peptide (BNP) 3935.0 (H) 0 - 450 pg/mL  Digoxin level     Status: Abnormal   Collection Time: 08/05/14 11:26 PM  Result Value Ref Range   Digoxin Level 0.4 (L) 0.8 - 2.0 ng/mL  TSH     Status: None   Collection Time: 08/05/14 11:26 PM  Result Value Ref Range   TSH 1.800 0.350 - 4.500 uIU/mL  Troponin I     Status: None   Collection Time: 08/05/14 11:26 PM  Result Value Ref Range   Troponin I <0.30 <0.30 ng/mL    Comment:        Due to the release kinetics of cTnI, a negative result within the first hours of the onset of symptoms does not rule out myocardial infarction with certainty. If myocardial infarction is still suspected, repeat the test at appropriate intervals.   Basic metabolic panel     Status: Abnormal   Collection Time: 08/06/14  2:55 AM  Result Value Ref Range   Sodium 143 137 - 147 mEq/L   Potassium 5.2 3.7 - 5.3 mEq/L   Chloride 104 96 - 112 mEq/L   CO2 30 19 - 32 mEq/L   Glucose, Bld 75 70 - 99 mg/dL   BUN 36 (H) 6 - 23 mg/dL   Creatinine, Ser 1.46 (H) 0.50 - 1.35 mg/dL   Calcium 8.4 8.4 - 10.5 mg/dL   GFR calc non Af Amer 42 (L) >90 mL/min   GFR calc Af Amer 49 (L) >90 mL/min    Comment: (NOTE) The eGFR has been calculated using the CKD EPI equation. This calculation has not been validated in all clinical situations. eGFR's persistently <90 mL/min signify possible Chronic Kidney Disease.    Anion gap 9 5 - 15  Protime-INR     Status: Abnormal   Collection Time: 08/06/14  2:55 AM  Result Value Ref Range   Prothrombin Time 33.3 (H) 11.6 - 15.2 seconds   INR 3.24 (H) 0.00 - 1.49    Imaging: No results found.  Assessment:  Principal Problem:   Atrial fibrillation with RVR Active Problems:   Permanent atrial fibrillation   COPD mixed  type   PAD (peripheral artery disease)   Pacemaker - [employer] Accent DR RF 2010 (initial pacemaker implant 1991), programmed VVIR   Orthostatic hypotension   AAA (abdominal aortic aneurysm)   Plan:  1. Atrial fibrillation - BP will not allow addition of CCB or BB at this point. Dig level is low at 0.4.  Give additional digoxin 0.25 IV now, then increase daily dose to 0.125 mg daily. 2. Fatigue/hypotension - Multiple possibile etiologies, however, he does not endorse any constitutional symptoms and labs/exam not consistent with clear infection. Will recheck CXR given COPD history. Decrease tamsulosin to 0.4 mg daily. He is on chronic prednisone - check cortisol level and ACTH. Was previously on florinef for orthostatic hypotension, however, may be adrenally insufficient. 3. Pedal edema - not clear if this represents congestive heart failure or not. Re-check 2D echo today. Decrease diuretics to 20 mg IV daily, due to hypotension. 4. COPD - check CXR, no increase in productive cough or wheezing 5. Pacemaker - will re-interrogate today. Recently checked this Summer and working appropriately. 6. AAA - stable at 4.1 x 4.1 cm in 10/2013. Consider  re-assessment with CTA given hypotension, if other causes are not identified.  Time Spent Directly with Patient:  30 minutes  Length of Stay:  LOS: 1 day   Pixie Casino, MD, [facility] Attending Cardiologist [facility]  HILTY,Kenneth C 08/06/2014, 8:24 AM

## 2014-08-06 NOTE — Progress Notes (Signed)
Unable to give patient cardizem 5mg  IV push due to low blood pressure.Dr.Whiltlock notified.

## 2014-08-06 NOTE — Plan of Care (Signed)
Problem: Phase I Progression Outcomes Goal: Anticoagulation Therapy per MD order Outcome: Completed/Met Date Met:  08/06/14

## 2014-08-07 DIAGNOSIS — I369 Nonrheumatic tricuspid valve disorder, unspecified: Secondary | ICD-10-CM

## 2014-08-07 DIAGNOSIS — J189 Pneumonia, unspecified organism: Secondary | ICD-10-CM

## 2014-08-07 LAB — BASIC METABOLIC PANEL
ANION GAP: 9 (ref 5–15)
Anion gap: 9 (ref 5–15)
BUN: 37 mg/dL — ABNORMAL HIGH (ref 6–23)
BUN: 36 mg/dL — AB (ref 6–23)
CALCIUM: 8.3 mg/dL — AB (ref 8.4–10.5)
CHLORIDE: 104 meq/L (ref 96–112)
CO2: 28 mEq/L (ref 19–32)
CO2: 31 mEq/L (ref 19–32)
CREATININE: 1.45 mg/dL — AB (ref 0.50–1.35)
Calcium: 8.4 mg/dL (ref 8.4–10.5)
Chloride: 105 mEq/L (ref 96–112)
Creatinine, Ser: 1.36 mg/dL — ABNORMAL HIGH (ref 0.50–1.35)
GFR calc Af Amer: 49 mL/min — ABNORMAL LOW (ref >90)
GFR calc Af Amer: 53 mL/min — ABNORMAL LOW (ref >90)
GFR calc non Af Amer: 43 mL/min — ABNORMAL LOW (ref >90)
GFR, EST NON AFRICAN AMERICAN: 46 mL/min — AB (ref >90)
GLUCOSE: 70 mg/dL (ref 70–99)
Glucose, Bld: 120 mg/dL — ABNORMAL HIGH (ref 70–99)
Potassium: 5.8 mEq/L — ABNORMAL HIGH (ref 3.7–5.3)
Potassium: 5 mEq/L (ref 3.7–5.3)
SODIUM: 144 meq/L (ref 137–147)
Sodium: 142 mEq/L (ref 137–147)

## 2014-08-07 LAB — PROTIME-INR
INR: 2.65 — ABNORMAL HIGH (ref 0.00–1.49)
Prothrombin Time: 28.5 seconds — ABNORMAL HIGH (ref 11.6–15.2)

## 2014-08-07 MED ORDER — SODIUM POLYSTYRENE SULFONATE 15 GM/60ML PO SUSP
30.0000 g | Freq: Once | ORAL | Status: AC
  Administered 2014-08-07: 30 g via ORAL
  Filled 2014-08-07: qty 120

## 2014-08-07 MED ORDER — AZITHROMYCIN 500 MG PO TABS
500.0000 mg | ORAL_TABLET | Freq: Every day | ORAL | Status: AC
  Administered 2014-08-07: 500 mg via ORAL
  Filled 2014-08-07: qty 1

## 2014-08-07 MED ORDER — AZITHROMYCIN 250 MG PO TABS
250.0000 mg | ORAL_TABLET | Freq: Every day | ORAL | Status: AC
  Administered 2014-08-08 – 2014-08-11 (×4): 250 mg via ORAL
  Filled 2014-08-07 (×4): qty 1

## 2014-08-07 MED ORDER — WARFARIN SODIUM 2 MG PO TABS
2.0000 mg | ORAL_TABLET | Freq: Every day | ORAL | Status: AC
Start: 2014-08-07 — End: 2014-08-07
  Administered 2014-08-07: 2 mg via ORAL
  Filled 2014-08-07: qty 1

## 2014-08-07 MED ORDER — DEXTROSE 50 % IV SOLN
0.5000 | Freq: Once | INTRAVENOUS | Status: AC
Start: 2014-08-07 — End: 2014-08-07
  Administered 2014-08-07: 25 mL via INTRAVENOUS
  Filled 2014-08-07: qty 50

## 2014-08-07 MED ORDER — WARFARIN - PHARMACIST DOSING INPATIENT
Freq: Every day | Status: DC
  Administered 2014-08-07 – 2014-08-08 (×2): 1
  Administered 2014-08-09: 18:00:00

## 2014-08-07 MED ORDER — INSULIN ASPART 100 UNIT/ML ~~LOC~~ SOLN
10.0000 [IU] | Freq: Once | SUBCUTANEOUS | Status: DC
  Filled 2014-08-07: qty 0.1

## 2014-08-07 MED ORDER — DEXTROSE 50 % IV SOLN
0.5000 | INTRAVENOUS | Status: AC
Start: 2014-08-07 — End: 2014-08-07
  Administered 2014-08-07: 25 mL via INTRAVENOUS
  Filled 2014-08-07: qty 50

## 2014-08-07 MED ORDER — INSULIN ASPART 100 UNIT/ML IV SOLN
10.0000 [IU] | INTRAVENOUS | Status: AC
  Administered 2014-08-07: 10 [IU] via INTRAVENOUS
  Filled 2014-08-07: qty 0.1

## 2014-08-07 MED ORDER — DEXTROSE 50 % IV SOLN
INTRAVENOUS | Status: AC
  Filled 2014-08-07: qty 50

## 2014-08-07 NOTE — Progress Notes (Signed)
     F/u BMP reviewed. Potassium improved from 5.8 to 5.0. Scr also improved from 1.45 to 1.36.   Shakendra Griffeth 08/07/2014

## 2014-08-07 NOTE — Progress Notes (Addendum)
Nurse trying to contact cardiology to give information that pt's son would like them to contact him. Paged card master and did not get a response. New note entered by AT&TBrittainy Simmons PA-C. Paged and spoke with her and she will look at pt's information as soon as she can and attempt to contact pt"s son.

## 2014-08-07 NOTE — Progress Notes (Signed)
ANTICOAGULATION CONSULT NOTE - Initial Consult  Pharmacy Consult for coumadin Indication: atrial fibrillation  Allergies  Allergen Reactions  . Sulfonamide Derivatives     REACTION: Swelling of jaws and had welps    Patient Measurements: Height: 5\' 8"  (172.7 cm) Weight: 154 lb 9.6 oz (70.126 kg) IBW/kg (Calculated) : 68.4   Vital Signs: Temp: 98 F (36.7 C) (11/05 0721) Temp Source: Oral (11/05 0721) BP: 112/74 mmHg (11/05 0700) Pulse Rate: 98 (11/05 0721)  Labs:  Recent Labs  08/05/14 2326 08/06/14 0255 08/07/14 0300  HGB 14.9  --   --   HCT 46.1  --   --   PLT 95*  --   --   LABPROT 31.7* 33.3* 28.5*  INR 3.04* 3.24* 2.65*  CREATININE 1.55* 1.46* 1.45*  TROPONINI <0.30  --   --     Estimated Creatinine Clearance: 36.7 mL/min (by C-G formula based on Cr of 1.45).   Medical History: Past Medical History  Diagnosis Date  . Osteoarthrosis, unspecified whether generalized or localized, unspecified site   . Coronary atherosclerosis of unspecified type of vessel, native or graft   . Chronic rhinitis   . Chronic airway obstruction, not elsewhere classified   . Atrial fibrillation     chronic-coumadin , VVIR pacemaker-Dr. Alanda AmassWeintraub  . Gout     Dr Novella Robrulsow  . Chronic kidney disease     mild renal insufficiency  . Shortness of breath   . Hypertension   . Myocardial infarction   . Recurrent upper respiratory infection (URI)     cold 2 weeks ago  . Pneumonia     10 years ago  . Anemia   . H/O hiatal hernia   . Depression   . Angina     Medications:  Prescriptions prior to admission  Medication Sig Dispense Refill Last Dose  . aspirin EC 81 MG tablet Take 1 tablet (81 mg total) by mouth daily.     Marland Kitchen. atorvastatin (LIPITOR) 20 MG tablet Take 20 mg by mouth daily.     08/04/2014  . colchicine-probenecid 0.5-500 MG per tablet Take 1 tablet by mouth daily.    08/04/2014  . digoxin (LANOXIN) 0.125 MG tablet Take 1/2 tablet daily 20 tablet 11 08/04/2014  . ferrous  sulfate 325 (65 FE) MG EC tablet Take 325 mg by mouth daily with breakfast.   08/04/2014  . finasteride (PROSCAR) 5 MG tablet Take 5 mg by mouth daily.   08/04/2014  . furosemide (LASIX) 20 MG tablet Take 20 mg by mouth daily as needed.    08/04/2014 at Unknown time  . potassium chloride (K-DUR) 10 MEQ tablet Take 10 mEq by mouth daily.   Past Week at Unknown time  . predniSONE (DELTASONE) 5 MG tablet Take 5 mg by mouth 2 (two) times daily with a meal. Pt currently only takes 5 mg   Past Week at Unknown time  . probenecid (BENEMID) 500 MG tablet Take 500 mg by mouth daily.    08/04/2014  . Tamsulosin HCl (FLOMAX) 0.4 MG CAPS Take 0.4 mg by mouth daily.    08/04/2014  . albuterol (PROAIR HFA) 108 (90 BASE) MCG/ACT inhaler Inhale 2 puffs into the lungs 4 (four) times daily as needed. For shortness of breath     . formoterol (FORADIL) 12 MCG capsule for inhaler Place 1 capsule (12 mcg total) into inhaler and inhale 2 (two) times daily. 60 capsule 3 Taking  . Indacaterol Maleate (ARCAPTA NEOHALER) 75 MCG CAPS Place 1  capsule into inhaler and inhale daily. 30 capsule 3   . SYMBICORT 160-4.5 MCG/ACT inhaler as needed.   Taking    Assessment: 80 yo man to continue coumadin for afib.  His INR today is 2.65.  No bleeding reported Goal of Therapy:  INR 2-3 Monitor platelets by anticoagulation protocol: Yes   Plan:  Coumadin 2 mg po today Daily PT/INR  Thanks for allowing pharmacy to be a part of this patient's care.  Talbert CageLora Alzora Ha, PharmD Clinical Pharmacist, [phone] 08/07/2014,8:46 AM

## 2014-08-07 NOTE — Plan of Care (Signed)
Problem: Phase I Progression Outcomes Goal: Heart rate or rhythm control medication Outcome: Completed/Met Date Met:  08/07/14 Goal: Pain controlled with appropriate interventions Outcome: Not Applicable Date Met:  54/65/68 Goal: Initial discharge plan identified Outcome: Progressing Goal: Hemodynamically stable Outcome: Progressing

## 2014-08-07 NOTE — Progress Notes (Signed)
Echocardiogram 2D Echocardiogram has been performed.  Thanos Cousineau 08/07/2014, 3:14 PM

## 2014-08-07 NOTE — Progress Notes (Signed)
DAILY PROGRESS NOTE  Subjective:  No noted events overnight. BP has steadily improved. HR appears to be better controlled on increased dose digoxin. Noted to be hyperkalemic today at 5.8. Creatinine is stable, if not improving. Random cortisol level appears to be sufficient to exclude adrenal insufficiency. Weight is down from 159 to 154 lbs. Echo is still pending.  Objective:  Temp:  [97.6 F (36.4 C)-98.9 F (37.2 C)] 98 F (36.7 C) (11/05 0721) Pulse Rate:  [52-133] 98 (11/05 0721) Resp:  [14-50] 24 (11/05 0721) BP: (79-113)/(40-74) 112/74 mmHg (11/05 0700) SpO2:  [94 %-100 %] 100 % (11/05 0725) FiO2 (%):  [28 %] 28 % (11/05 0725) Weight:  [154 lb 9.6 oz (70.126 kg)] 154 lb 9.6 oz (70.126 kg) (11/05 0500) Weight change: -5 lb 14.3 oz (-2.674 kg)  Intake/Output from previous day: 11/04 0701 - 11/05 0700 In: 360 [P.O.:360] Out: 425 [Urine:425]  Intake/Output from this shift:    Medications: Current Facility-Administered Medications  Medication Dose Route Frequency Provider Last Rate Last Dose  . 0.9 %  sodium chloride infusion  250 mL Intravenous PRN Cletus Gash, MD      . acetaminophen (TYLENOL) tablet 650 mg  650 mg Oral Q4H PRN Cletus Gash, MD      . antiseptic oral rinse (CPC / CETYLPYRIDINIUM CHLORIDE 0.05%) solution 7 mL  7 mL Mouth Rinse BID Candee Furbish, MD   7 mL at 08/07/14 0017  . aspirin EC tablet 81 mg  81 mg Oral Daily Cletus Gash, MD   81 mg at 08/06/14 1009  . atorvastatin (LIPITOR) tablet 20 mg  20 mg Oral Daily Cletus Gash, MD   20 mg at 08/06/14 1009  . budesonide-formoterol (SYMBICORT) 160-4.5 MCG/ACT inhaler 2 puff  2 puff Inhalation BID Candee Furbish, MD   2 puff at 08/07/14 0723  . digoxin (LANOXIN) tablet 0.125 mg  0.125 mg Oral Daily Pixie Casino, MD      . diltiazem (CARDIZEM) injection 5 mg  5 mg Intravenous Once Cletus Gash, MD   5 mg at 08/06/14 0420  . furosemide (LASIX) injection 20 mg  20 mg Intravenous Daily  Pixie Casino, MD      . levalbuterol Penne Lash) nebulizer solution 0.63 mg  0.63 mg Nebulization Q6H PRN Cletus Gash, MD   0.63 mg at 08/07/14 0209  . potassium chloride (K-DUR) CR tablet 10 mEq  10 mEq Oral Daily Cletus Gash, MD   Stopped at 08/07/14 [phone]  . predniSONE (DELTASONE) tablet 5 mg  5 mg Oral BID WC Cletus Gash, MD   5 mg at 08/06/14 1646  . sodium chloride 0.9 % injection 3 mL  3 mL Intravenous Q12H Cletus Gash, MD   3 mL at 08/07/14 0016  . sodium chloride 0.9 % injection 3 mL  3 mL Intravenous PRN Cletus Gash, MD      . tamsulosin [facility]) capsule 0.4 mg  0.4 mg Oral QPC supper Pixie Casino, MD   0.4 mg at 08/06/14 1712  . warfarin (COUMADIN) tablet 2 mg  2 mg Oral q1800 Cletus Gash, MD   2 mg at 08/06/14 1712    Physical Exam: General appearance: alert and no distress Neck: no carotid bruit and no JVD Lungs: diminished breath sounds bilaterally Heart: irregularly irregular rhythm Abdomen: soft, non-tender; bowel sounds normal; no masses,  no organomegaly Extremities: edema 1+ pedal edema bilaterally Pulses: 2+ and symmetric Skin: diffuse ecchymosis, skin tears Neurologic: Mental status: Awake, follows commands, but  somewhat confused Psych: Appropriate  Lab Results: Results for orders placed or performed during the hospital encounter of 08/05/14 (from the past 48 hour(s))  MRSA PCR Screening     Status: None   Collection Time: 08/05/14  8:41 PM  Result Value Ref Range   MRSA by PCR NEGATIVE NEGATIVE    Comment:        The GeneXpert MRSA Assay (FDA approved for NASAL specimens only), is one component of a comprehensive MRSA colonization surveillance program. It is not intended to diagnose MRSA infection nor to guide or monitor treatment for MRSA infections.   Glucose, capillary     Status: Abnormal   Collection Time: 08/05/14 11:06 PM  Result Value Ref Range   Glucose-Capillary 103 (H) 70 - 99 mg/dL  CBC WITH DIFFERENTIAL      Status: Abnormal   Collection Time: 08/05/14 11:26 PM  Result Value Ref Range   WBC 8.2 4.0 - 10.5 K/uL   RBC 4.06 (L) 4.22 - 5.81 MIL/uL   Hemoglobin 14.9 13.0 - 17.0 g/dL   HCT 46.1 39.0 - 52.0 %   MCV 113.5 (H) 78.0 - 100.0 fL   MCH 36.7 (H) 26.0 - 34.0 pg   MCHC 32.3 30.0 - 36.0 g/dL   RDW 16.3 (H) 11.5 - 15.5 %   Platelets 95 (L) 150 - 400 K/uL    Comment: REPEATED TO VERIFY PLATELET COUNT CONFIRMED BY SMEAR    Neutrophils Relative % 78 (H) 43 - 77 %   Lymphocytes Relative 6 (L) 12 - 46 %   Monocytes Relative 15 (H) 3 - 12 %   Eosinophils Relative 0 0 - 5 %   Basophils Relative 1 0 - 1 %   Neutro Abs 6.4 1.7 - 7.7 K/uL   Lymphs Abs 0.5 (L) 0.7 - 4.0 K/uL   Monocytes Absolute 1.2 (H) 0.1 - 1.0 K/uL   Eosinophils Absolute 0.0 0.0 - 0.7 K/uL   Basophils Absolute 0.1 0.0 - 0.1 K/uL   RBC Morphology POLYCHROMASIA PRESENT    WBC Morphology ATYPICAL LYMPHOCYTES   Basic metabolic panel     Status: Abnormal   Collection Time: 08/05/14 11:26 PM  Result Value Ref Range   Sodium 145 137 - 147 mEq/L   Potassium 5.2 3.7 - 5.3 mEq/L   Chloride 105 96 - 112 mEq/L   CO2 31 19 - 32 mEq/L   Glucose, Bld 85 70 - 99 mg/dL   BUN 39 (H) 6 - 23 mg/dL   Creatinine, Ser 1.55 (H) 0.50 - 1.35 mg/dL   Calcium 8.7 8.4 - 10.5 mg/dL   GFR calc non Af Amer 39 (L) >90 mL/min   GFR calc Af Amer 46 (L) >90 mL/min    Comment: (NOTE) The eGFR has been calculated using the CKD EPI equation. This calculation has not been validated in all clinical situations. eGFR's persistently <90 mL/min signify possible Chronic Kidney Disease.    Anion gap 9 5 - 15  Protime-INR     Status: Abnormal   Collection Time: 08/05/14 11:26 PM  Result Value Ref Range   Prothrombin Time 31.7 (H) 11.6 - 15.2 seconds   INR 3.04 (H) 0.00 - 1.49  Magnesium     Status: None   Collection Time: 08/05/14 11:26 PM  Result Value Ref Range   Magnesium 2.4 1.5 - 2.5 mg/dL  Pro b natriuretic peptide (BNP)     Status: Abnormal    Collection Time: 08/05/14 11:26 PM  Result Value Ref  Range   Pro B Natriuretic peptide (BNP) 3935.0 (H) 0 - 450 pg/mL  Digoxin level     Status: Abnormal   Collection Time: 08/05/14 11:26 PM  Result Value Ref Range   Digoxin Level 0.4 (L) 0.8 - 2.0 ng/mL  TSH     Status: None   Collection Time: 08/05/14 11:26 PM  Result Value Ref Range   TSH 1.800 0.350 - 4.500 uIU/mL  Troponin I     Status: None   Collection Time: 08/05/14 11:26 PM  Result Value Ref Range   Troponin I <0.30 <0.30 ng/mL    Comment:        Due to the release kinetics of cTnI, a negative result within the first hours of the onset of symptoms does not rule out myocardial infarction with certainty. If myocardial infarction is still suspected, repeat the test at appropriate intervals.   Basic metabolic panel     Status: Abnormal   Collection Time: 08/06/14  2:55 AM  Result Value Ref Range   Sodium 143 137 - 147 mEq/L   Potassium 5.2 3.7 - 5.3 mEq/L   Chloride 104 96 - 112 mEq/L   CO2 30 19 - 32 mEq/L   Glucose, Bld 75 70 - 99 mg/dL   BUN 36 (H) 6 - 23 mg/dL   Creatinine, Ser 1.46 (H) 0.50 - 1.35 mg/dL   Calcium 8.4 8.4 - 10.5 mg/dL   GFR calc non Af Amer 42 (L) >90 mL/min   GFR calc Af Amer 49 (L) >90 mL/min    Comment: (NOTE) The eGFR has been calculated using the CKD EPI equation. This calculation has not been validated in all clinical situations. eGFR's persistently <90 mL/min signify possible Chronic Kidney Disease.    Anion gap 9 5 - 15  Protime-INR     Status: Abnormal   Collection Time: 08/06/14  2:55 AM  Result Value Ref Range   Prothrombin Time 33.3 (H) 11.6 - 15.2 seconds   INR 3.24 (H) 0.00 - 1.49  Cortisol     Status: None   Collection Time: 08/06/14  9:34 AM  Result Value Ref Range   Cortisol, Plasma 25.5 ug/dL    Comment: (NOTE) AM:  4.3 - 22.4 ug/dL PM:  3.1 - 16.7 ug/dL Performed at [facility] metabolic panel     Status: Abnormal   Collection Time: 08/07/14  3:00  AM  Result Value Ref Range   Sodium 142 137 - 147 mEq/L   Potassium 5.8 (H) 3.7 - 5.3 mEq/L   Chloride 105 96 - 112 mEq/L   CO2 28 19 - 32 mEq/L   Glucose, Bld 120 (H) 70 - 99 mg/dL   BUN 37 (H) 6 - 23 mg/dL   Creatinine, Ser 1.45 (H) 0.50 - 1.35 mg/dL   Calcium 8.3 (L) 8.4 - 10.5 mg/dL   GFR calc non Af Amer 43 (L) >90 mL/min   GFR calc Af Amer 49 (L) >90 mL/min    Comment: (NOTE) The eGFR has been calculated using the CKD EPI equation. This calculation has not been validated in all clinical situations. eGFR's persistently <90 mL/min signify possible Chronic Kidney Disease.    Anion gap 9 5 - 15  Protime-INR     Status: Abnormal   Collection Time: 08/07/14  3:00 AM  Result Value Ref Range   Prothrombin Time 28.5 (H) 11.6 - 15.2 seconds   INR 2.65 (H) 0.00 - 1.49    Imaging: Dg Chest  Port 1 View  08/06/2014   CLINICAL DATA:  Cough  EXAM: PORTABLE CHEST - 1 VIEW  COMPARISON:  10/11/2010  FINDINGS: Cardiac enlargement with transvenous pacemaker. Negative for heart failure.a  Patchy airspace disease has developed in the right lower lobe which could represent atelectasis or pneumonia. Negative for effusion. Left upper lobe emphysema.  IMPRESSION: Interval development of mild right lower lobe atelectasis or pneumonia.   Electronically Signed   By: Franchot Gallo M.D.   On: 08/06/2014 09:31    Assessment:  Principal Problem:   Atrial fibrillation with RVR Active Problems:   Permanent atrial fibrillation   COPD mixed type   PAD (peripheral artery disease)   Pacemaker - [employer] Accent DR RF 2010 (initial pacemaker implant 1991), programmed VVIR   Orthostatic hypotension   AAA (abdominal aortic aneurysm)   Plan:  1. Atrial fibrillation - better rate control on increased dose digoxin. Intermittent pacing. Continue current medications. 2. Fatigue/hypotension - improved with medication changes, better rate control and diuresis. Does not appear to be adrenally insufficient.  Awaiting 2D echo. Ambulate with PT today to determine mobility and needs for possible home health (he lives alone) 3. COPD - possible right lower lobe airspace disease concerning for pneumonia. Start azithromycin. 4. Pacemaker - re-interrogated, working appropriately 5. AAA - stable at 4.1 x 4.1 cm in 10/2013. 6. Hyperkalemia - Unknown etiology - give insulin/1/2 amp D50 and kayexelate. 7. Dispo - possibly tomorrow. PT eval for dizziness, need for ?home PT or services.  Time Spent Directly with Patient:  30 minutes  Length of Stay:  LOS: 2 days   Pixie Casino, MD, [facility] Attending Cardiologist [facility]  HILTY,Kenneth C 08/07/2014, 8:04 AM

## 2014-08-08 DIAGNOSIS — J189 Pneumonia, unspecified organism: Secondary | ICD-10-CM | POA: Diagnosis present

## 2014-08-08 DIAGNOSIS — R531 Weakness: Secondary | ICD-10-CM

## 2014-08-08 LAB — CBC WITH DIFFERENTIAL/PLATELET
BASOS ABS: 0 10*3/uL (ref 0.0–0.1)
BASOS PCT: 0 % (ref 0–1)
Eosinophils Absolute: 0 10*3/uL (ref 0.0–0.7)
Eosinophils Relative: 0 % (ref 0–5)
HCT: 45.1 % (ref 39.0–52.0)
Hemoglobin: 14.6 g/dL (ref 13.0–17.0)
LYMPHS PCT: 7 % — AB (ref 12–46)
Lymphs Abs: 0.5 10*3/uL — ABNORMAL LOW (ref 0.7–4.0)
MCH: 36.7 pg — ABNORMAL HIGH (ref 26.0–34.0)
MCHC: 32.4 g/dL (ref 30.0–36.0)
MCV: 113.3 fL — ABNORMAL HIGH (ref 78.0–100.0)
MONOS PCT: 11 % (ref 3–12)
Monocytes Absolute: 0.9 10*3/uL (ref 0.1–1.0)
NEUTROS PCT: 82 % — AB (ref 43–77)
Neutro Abs: 6.5 10*3/uL (ref 1.7–7.7)
Platelets: 82 10*3/uL — ABNORMAL LOW (ref 150–400)
RBC: 3.98 MIL/uL — ABNORMAL LOW (ref 4.22–5.81)
RDW: 15.6 % — ABNORMAL HIGH (ref 11.5–15.5)
WBC: 8 10*3/uL (ref 4.0–10.5)

## 2014-08-08 LAB — PROTIME-INR
INR: 2.37 — AB (ref 0.00–1.49)
PROTHROMBIN TIME: 26.1 s — AB (ref 11.6–15.2)

## 2014-08-08 LAB — BASIC METABOLIC PANEL
Anion gap: 11 (ref 5–15)
BUN: 36 mg/dL — AB (ref 6–23)
CHLORIDE: 103 meq/L (ref 96–112)
CO2: 30 meq/L (ref 19–32)
Calcium: 8.2 mg/dL — ABNORMAL LOW (ref 8.4–10.5)
Creatinine, Ser: 1.34 mg/dL (ref 0.50–1.35)
GFR calc Af Amer: 54 mL/min — ABNORMAL LOW (ref >90)
GFR, EST NON AFRICAN AMERICAN: 47 mL/min — AB (ref >90)
Glucose, Bld: 119 mg/dL — ABNORMAL HIGH (ref 70–99)
POTASSIUM: 4.8 meq/L (ref 3.7–5.3)
SODIUM: 144 meq/L (ref 137–147)

## 2014-08-08 LAB — ACTH: C206 ACTH: 18 pg/mL (ref 6–50)

## 2014-08-08 LAB — PRO B NATRIURETIC PEPTIDE: Pro B Natriuretic peptide (BNP): 5097 pg/mL — ABNORMAL HIGH (ref 0–450)

## 2014-08-08 MED ORDER — WARFARIN SODIUM 2 MG PO TABS
2.0000 mg | ORAL_TABLET | Freq: Every day | ORAL | Status: AC
  Administered 2014-08-08: 2 mg via ORAL
  Filled 2014-08-08: qty 1

## 2014-08-08 MED ORDER — FUROSEMIDE 40 MG PO TABS
40.0000 mg | ORAL_TABLET | Freq: Every day | ORAL | Status: DC
  Administered 2014-08-08 – 2014-08-12 (×5): 40 mg via ORAL
  Filled 2014-08-08 (×5): qty 1

## 2014-08-08 NOTE — Progress Notes (Signed)
Pt has been urinating in urinal all day and using bedside commode. Pt has slept most of day. When nurse asks pt if he has any pain he states no. Nurse asks pt if he needs anything and pt states no he's ok. At around 1800 pt had an incontinent episode and was soaked in urine sm amount of stool which was unusual for pt; unsure if pt was in deep sleep or more confuse which may have contributed to episode. Was reported by night nurse that pt had a confused episode last night. Pt has been alert most of day and oriented to birthday, place and situation. Pt was bathed at this time. Pt had spilled fluid on floor and possible some urine on floor and nurse called environmental to clean floor. Son was in earlier visiting pt. Pt back to sleep after bath with no complaints. Will monitor.

## 2014-08-08 NOTE — Evaluation (Signed)
Physical Therapy Evaluation Patient Details Name: Willie [name] MRN: [id number] DOB: [birth year] Today's Date: 08/08/2014   History of Present Illness  80 y.o. male w/ PMHx significant for chronic afib and bradycardia requiring pacemaker, CAD s/p LAD stent 2000, severe COPD on home 02,  [facility] of [facility] and [facility] and was subsequent [facility] 08/05/2014 due to concerns of pacer failure.  Clinical Impression  Patient demonstrates deficits in functional mobility as indicated below. Will need continued skilled PT to address deficits and maximize function. Will see as indicated and progress as tolerated.  Concerned regarding patient cognition and mobility at this time, do not feel patient is safe for dc home alone, Will need ST SNF.    Follow Up Recommendations SNF;Supervision/Assistance - 24 hour    Equipment Recommendations  Other (comment) (TBD)    Recommendations for Other Services       Precautions / Restrictions Precautions Precautions: Fall      Mobility  Bed Mobility Overal bed mobility: Needs Assistance Bed Mobility: Supine to Sit;Sit to Supine     Supine to sit: Mod assist Sit to supine: Min assist   General bed mobility comments: Moderate assist to pull to upright, difficulty repositioning and coming to EOB secondary to weakness.  Transfers Overall transfer level: Needs assistance Equipment used: Rolling walker (2 wheeled) Transfers: Sit to/from [employer];Stand Pivot Transfers Sit to Stand: Min assist Stand pivot transfers: Min assist       General transfer comment: VCs for hand placement and safety with mobility.    Ambulation/Gait Ambulation/Gait assistance: Min assist Ambulation Distance (Feet): 12 Feet Assistive device: Rolling walker (2 wheeled) Gait Pattern/deviations: Step-to pattern;Decreased stride length;Shuffle;Trunk flexed;Narrow base of support Gait velocity: decreased Gait velocity  interpretation: Below normal speed for age/gender General Gait Details: limited by fatigue and generalized weakness, patient declined further ambulation  Stairs            Wheelchair Mobility    Modified Rankin (Stroke Patients Only)       Balance Overall balance assessment: Needs assistance Sitting-balance support: Feet supported Sitting balance-Leahy Scale: Fair     Standing balance support: Bilateral upper extremity supported Standing balance-Leahy Scale: Poor                               Pertinent Vitals/Pain Pain Assessment: Faces Faces Pain Scale: No hurt    Home Living Family/patient expects to be discharged to:: Skilled nursing facility Living Arrangements: Alone (Children live closeby)                    Prior Function Level of Independence: Independent with assistive device(s)               Hand Dominance   Dominant Hand: Right    Extremity/Trunk Assessment   Upper Extremity Assessment: Generalized weakness           Lower Extremity Assessment: Generalized weakness (+ arthritic changes and limited ROM)      Cervical / Trunk Assessment: Kyphotic  Communication   Communication: HOH  Cognition Arousal/Alertness: Awake/alert Behavior During Therapy: Flat affect Overall Cognitive Status: No family/caregiver present to determine baseline cognitive functioning Area of Impairment: Orientation;Attention;Awareness;Problem solving;Memory Orientation Level: Disoriented to;Time;Situation;Place Current Attention Level: Sustained Memory: Decreased short-term memory     Awareness: Intellectual Problem Solving: Slow processing;Decreased initiation;Difficulty sequencing;Requires verbal cues;Requires tactile cues General Comments: patient with poor sequencing and  cognitive recognition of basic daily tasks, [facility] the [facility] and was given cloth to perform hygiene but he could not follow cues for moving gown and he subsequently started  to perform hygiene over top of gown despite cues to correct.    General Comments      Exercises        Assessment/Plan    PT Assessment Patient needs continued PT services  PT Diagnosis Difficulty walking;Abnormality of gait;Generalized weakness;Altered mental status   PT Problem List Decreased strength;Decreased range of motion;Decreased activity tolerance;Decreased balance;Decreased mobility;Decreased cognition;Decreased knowledge of use of DME;Decreased safety awareness;Cardiopulmonary status limiting activity  PT Treatment Interventions DME instruction;Gait training;Functional mobility training;Therapeutic activities;Therapeutic exercise;Balance training;Cognitive remediation;Patient/family education   PT Goals (Current goals can be found in the Care Plan section) Acute Rehab PT Goals Patient Stated Goal: to get stronger PT Goal Formulation: With patient Time For Goal Achievement: Apr 10, 2014 Potential to Achieve Goals: Fair    Frequency Min 2X/week   Barriers to discharge Decreased caregiver support (lives alone)      Co-evaluation               End of Session Equipment Utilized During Treatment: Gait belt;Oxygen Activity Tolerance: Patient limited by fatigue Patient left: in bed;with call bell/phone within reach;with bed alarm set (in chair position) Nurse Communication: Mobility status (pt request breathing treatment)         Time: [id number] PT Time Calculation (min): 19 min   Charges:   PT Evaluation $Initial PT Evaluation Tier I: 1 Procedure PT Treatments $Therapeutic Activity: 8-22 mins   PT G CodesFabio Asa:          Matti Minney J 08/08/2014, 9:07 AM Charlotte Crumbevon Allye Hoyos, PT DPT  [phone]

## 2014-08-08 NOTE — Clinical Documentation Improvement (Signed)
MD's, NP's, and PA's  Noted patent with following lab values BUN  (36   36   37   36   39 ), (Creatinine (1.34  1.36  1.45   1.46),  GFR ( 47  46  43  42 ), with history of PVD, Heart failure, would either of the following diagnoses below be appropriate for this admission?  Thank you   Chronic kidney disease, stage 3 (moderate) - GFR 30-59  Chronic kidney disease, stage 4 (severe) - GFR 15-29  Chronic kidney disease, stage 5- GFR < 15      Thank You,   Raymond GurneyL. J. Synthia Fairbank, RN, BSN, CDS [facility] [phone]

## 2014-08-08 NOTE — Clinical Social Work Note (Signed)
Fax[facility] [place].    CSW will continue to follow.  Merlyn LotJenna Holoman, LCSWA Clinical Social Worker [phone]

## 2014-08-08 NOTE — Progress Notes (Signed)
DAILY PROGRESS NOTE  Subjective:  Potassium improved today, proBNP has increased - weight has trended up slightly. BP is stabilely improved. INR still therapeutic. Echo yesterday shows EF of 45-50%, moderate to severely dilated RV with severely reduced RV systolic function. Moderate TR and RVSP of 43 mmHg.  Objective:  Temp:  [97.6 F (36.4 C)-98.2 F (36.8 C)] 97.6 F (36.4 C) (11/06 0747) Pulse Rate:  [42-136] 85 (11/06 0500) Resp:  [12-26] 19 (11/06 0500) BP: (88-125)/(53-80) 115/65 mmHg (11/06 0500) SpO2:  [92 %-100 %] 97 % (11/06 0500) Weight:  [157 lb (71.215 kg)] 157 lb (71.215 kg) (11/06 0500) Weight change: 2 lb 6.4 oz (1.089 kg)  Intake/Output from previous day: 11/05 0701 - 11/06 0700 In: 971 [P.O.:968; I.V.:3] Out: 1401 [Urine:1400; Stool:1]  Intake/Output from this shift:    Medications: Current Facility-Administered Medications  Medication Dose Route Frequency Provider Last Rate Last Dose  . 0.9 %  sodium chloride infusion  250 mL Intravenous PRN Cletus Gash, MD      . acetaminophen (TYLENOL) tablet 650 mg  650 mg Oral Q4H PRN Cletus Gash, MD      . antiseptic oral rinse (CPC / CETYLPYRIDINIUM CHLORIDE 0.05%) solution 7 mL  7 mL Mouth Rinse BID Candee Furbish, MD   7 mL at 08/07/14 2200  . aspirin EC tablet 81 mg  81 mg Oral Daily Cletus Gash, MD   81 mg at 08/07/14 0956  . atorvastatin (LIPITOR) tablet 20 mg  20 mg Oral Daily Cletus Gash, MD   20 mg at 08/07/14 0956  . azithromycin (ZITHROMAX) tablet 250 mg  250 mg Oral Daily Pixie Casino, MD      . budesonide-formoterol [facility]) 160-4.5 MCG/ACT inhaler 2 puff  2 puff Inhalation BID Candee Furbish, MD   2 puff at 08/07/14 2012  . digoxin (LANOXIN) tablet 0.125 mg  0.125 mg Oral Daily Pixie Casino, MD   0.125 mg at 08/07/14 0956  . furosemide (LASIX) injection 20 mg  20 mg Intravenous Daily Pixie Casino, MD   20 mg at 08/07/14 1002  . levalbuterol (XOPENEX) nebulizer solution 0.63  mg  0.63 mg Nebulization Q6H PRN Cletus Gash, MD   0.63 mg at 08/07/14 0209  . potassium chloride (K-DUR) CR tablet 10 mEq  10 mEq Oral Daily Cletus Gash, MD   Stopped at 08/07/14 [phone]  . predniSONE (DELTASONE) tablet 5 mg  5 mg Oral BID WC Cletus Gash, MD   5 mg at 08/07/14 1744  . sodium chloride 0.9 % injection 3 mL  3 mL Intravenous Q12H Cletus Gash, MD   3 mL at 08/07/14 2200  . sodium chloride 0.9 % injection 3 mL  3 mL Intravenous PRN Cletus Gash, MD      . tamsulosin [facility]) capsule 0.4 mg  0.4 mg Oral QPC supper Pixie Casino, MD   0.4 mg at 08/07/14 1744  . Warfarin - Pharmacist Dosing Inpatient   Does not apply T0626 Candee Furbish, MD   1 each at 08/07/14 1800    Physical Exam: General appearance: alert and no distress Neck: no carotid bruit and no JVD Lungs: diminished breath sounds bilaterally Heart: irregularly irregular rhythm Abdomen: soft, non-tender; bowel sounds normal; no masses,  no organomegaly Extremities: edema 1+ pedal edema bilaterally Pulses: 2+ and symmetric Skin: diffuse ecchymosis, skin tears Neurologic: Mental status: Awake, follows commands, but somewhat confused Psych: Appropriate  Lab Results: Results for orders placed or performed during the hospital encounter  of 08/05/14 (from the past 48 hour(s))  Cortisol     Status: None   Collection Time: 08/06/14  9:34 AM  Result Value Ref Range   Cortisol, Plasma 25.5 ug/dL    Comment: (NOTE) AM:  4.3 - 22.4 ug/dL PM:  3.1 - 16.7 ug/dL Performed at [facility] metabolic panel     Status: Abnormal   Collection Time: 08/07/14  3:00 AM  Result Value Ref Range   Sodium 142 137 - 147 mEq/L   Potassium 5.8 (H) 3.7 - 5.3 mEq/L   Chloride 105 96 - 112 mEq/L   CO2 28 19 - 32 mEq/L   Glucose, Bld 120 (H) 70 - 99 mg/dL   BUN 37 (H) 6 - 23 mg/dL   Creatinine, Ser 1.45 (H) 0.50 - 1.35 mg/dL   Calcium 8.3 (L) 8.4 - 10.5 mg/dL   GFR calc non Af Amer 43 (L) >90 mL/min    GFR calc Af Amer 49 (L) >90 mL/min    Comment: (NOTE) The eGFR has been calculated using the CKD EPI equation. This calculation has not been validated in all clinical situations. eGFR's persistently <90 mL/min signify possible Chronic Kidney Disease.    Anion gap 9 5 - 15  Protime-INR     Status: Abnormal   Collection Time: 08/07/14  3:00 AM  Result Value Ref Range   Prothrombin Time 28.5 (H) 11.6 - 15.2 seconds   INR 2.65 (H) 0.00 - 5.27  Basic metabolic panel     Status: Abnormal   Collection Time: 08/07/14  1:00 PM  Result Value Ref Range   Sodium 144 137 - 147 mEq/L   Potassium 5.0 3.7 - 5.3 mEq/L   Chloride 104 96 - 112 mEq/L   CO2 31 19 - 32 mEq/L   Glucose, Bld 70 70 - 99 mg/dL   BUN 36 (H) 6 - 23 mg/dL   Creatinine, Ser 1.36 (H) 0.50 - 1.35 mg/dL   Calcium 8.4 8.4 - 10.5 mg/dL   GFR calc non Af Amer 46 (L) >90 mL/min   GFR calc Af Amer 53 (L) >90 mL/min    Comment: (NOTE) The eGFR has been calculated using the CKD EPI equation. This calculation has not been validated in all clinical situations. eGFR's persistently <90 mL/min signify possible Chronic Kidney Disease.    Anion gap 9 5 - 15  Basic metabolic panel     Status: Abnormal   Collection Time: 08/08/14  3:18 AM  Result Value Ref Range   Sodium 144 137 - 147 mEq/L   Potassium 4.8 3.7 - 5.3 mEq/L   Chloride 103 96 - 112 mEq/L   CO2 30 19 - 32 mEq/L   Glucose, Bld 119 (H) 70 - 99 mg/dL   BUN 36 (H) 6 - 23 mg/dL   Creatinine, Ser 1.34 0.50 - 1.35 mg/dL   Calcium 8.2 (L) 8.4 - 10.5 mg/dL   GFR calc non Af Amer 47 (L) >90 mL/min   GFR calc Af Amer 54 (L) >90 mL/min    Comment: (NOTE) The eGFR has been calculated using the CKD EPI equation. This calculation has not been validated in all clinical situations. eGFR's persistently <90 mL/min signify possible Chronic Kidney Disease.    Anion gap 11 5 - 15  Protime-INR     Status: Abnormal   Collection Time: 08/08/14  3:18 AM  Result Value Ref Range    Prothrombin Time 26.1 (H) 11.6 - 15.2 seconds  INR 2.37 (H) 0.00 - 1.49  CBC with Differential     Status: Abnormal   Collection Time: 08/08/14  3:18 AM  Result Value Ref Range   WBC 8.0 4.0 - 10.5 K/uL   RBC 3.98 (L) 4.22 - 5.81 MIL/uL   Hemoglobin 14.6 13.0 - 17.0 g/dL   HCT 45.1 39.0 - 52.0 %   MCV 113.3 (H) 78.0 - 100.0 fL   MCH 36.7 (H) 26.0 - 34.0 pg   MCHC 32.4 30.0 - 36.0 g/dL   RDW 15.6 (H) 11.5 - 15.5 %   Platelets 82 (L) 150 - 400 K/uL    Comment: CONSISTENT WITH PREVIOUS RESULT   Neutrophils Relative % 82 (H) 43 - 77 %   Neutro Abs 6.5 1.7 - 7.7 K/uL   Lymphocytes Relative 7 (L) 12 - 46 %   Lymphs Abs 0.5 (L) 0.7 - 4.0 K/uL   Monocytes Relative 11 3 - 12 %   Monocytes Absolute 0.9 0.1 - 1.0 K/uL   Eosinophils Relative 0 0 - 5 %   Eosinophils Absolute 0.0 0.0 - 0.7 K/uL   Basophils Relative 0 0 - 1 %   Basophils Absolute 0.0 0.0 - 0.1 K/uL  Pro b natriuretic peptide (BNP)     Status: Abnormal   Collection Time: 08/08/14  3:18 AM  Result Value Ref Range   Pro B Natriuretic peptide (BNP) 5097.0 (H) 0 - 450 pg/mL    Imaging: Dg Chest Port 1 View  08/06/2014   CLINICAL DATA:  Cough  EXAM: PORTABLE CHEST - 1 VIEW  COMPARISON:  10/11/2010  FINDINGS: Cardiac enlargement with transvenous pacemaker. Negative for heart failure.a  Patchy airspace disease has developed in the right lower lobe which could represent atelectasis or pneumonia. Negative for effusion. Left upper lobe emphysema.  IMPRESSION: Interval development of mild right lower lobe atelectasis or pneumonia.   Electronically Signed   By: Franchot Gallo M.D.   On: 08/06/2014 09:31    Assessment:  Principal Problem:   Atrial fibrillation with RVR Active Problems:   Permanent atrial fibrillation   COPD mixed type   PAD (peripheral artery disease)   Pacemaker - St. Jude Accent DR RF 2010 (initial pacemaker implant 1991), programmed VVIR   Orthostatic hypotension   AAA (abdominal aortic  aneurysm)   Plan:  1. Atrial fibrillation - better rate control on increased dose digoxin. Intermittent pacing. Continue current medications. 2. Fatigue/hypotension - resolved for the most part. BP stable.  3. COPD - possible right lower lobe airspace disease concerning for pneumonia on azithromycin. 4. Pacemaker - re-interrogated, working appropriately 5. AAA - stable at 4.1 x 4.1 cm in 10/2013. 6. Hyperkalemia - resolved with treatment. Now on renal diet. 7. Dispo - will probably need SNF. Await PT eval for dizziness and weakness.  Time Spent Directly with Patient:  30 minutes  Length of Stay:  LOS: 3 days   Pixie Casino, MD, [facility] Attending Cardiologist [facility]  HILTY,Kenneth C 08/08/2014, 8:28 AM

## 2014-08-08 NOTE — Clinical Social Work Psychosocial (Signed)
Clinical Social Work Department BRIEF PSYCHOSOCIAL ASSESSMENT 08/08/2014  Patient:  Willie [name],Willie [name]     Account Number:  [id number]     Admit date:  08/05/2014  Clinical Social Worker:  Merlyn LotHOLOMAN,Luzmaria Devaux, CLINICAL SOCIAL WORKER  Date/Time:  08/08/2014 02:30 PM  Referred by:  Physician  Date Referred:  08/08/2014 Referred for  SNF Placement   Other Referral:   Interview type:  Family Other interview type:    PSYCHOSOCIAL DATA Living Status:  FAMILY Admitted from facility:   Level of care:   Primary support name:  Remigio Eisenmengereddy Primary support relationship to patient:  CHILD, ADULT Degree of support available:   Patients son reports high level of support    CURRENT CONCERNS Current Concerns  Post-Acute Placement   Other Concerns:    SOCIAL WORK ASSESSMENT / PLAN CSW spoke with patients son concerning SNF.  Patients son would [facility] [place]- contact person Roney MansJennifer Smith [phone].  CSW will continue to follow.   Assessment/plan status:  Psychosocial Support/Ongoing Assessment of Needs Other assessment/ plan:   FL2  PASAR   Information/referral to community resources:    PATIENT'S/FAMILY'S RESPONSE TO PLAN OF CARE: Patients family is agreeable to plan for patient to go [place] SN[place].       Merlyn LotJenna Holoman, LCSWA Clinical Social Worker [phone]

## 2014-08-08 NOTE — Progress Notes (Signed)
Called and spoke to son Remigio Eisenmengereddy (Pt's about pt to give him an update. He stated to tell nurse to call him if anything changes significant and that he lives 1 1/2 hrs away so he needs advance notice. Will pass on to night nurse.

## 2014-08-08 NOTE — Progress Notes (Signed)
ANTICOAGULATION CONSULT NOTE - Follow Up Pharmacy Consult for coumadin Indication: atrial fibrillation  Allergies  Allergen Reactions  . Sulfonamide Derivatives     REACTION: Swelling of jaws and had welps    Patient Measurements: Height: 5\' 8"  (172.7 cm) Weight: 157 lb (71.215 kg) IBW/kg (Calculated) : 68.4   Vital Signs: Temp: 97.6 F (36.4 C) (11/06 0747) Temp Source: Oral (11/06 0747) BP: 102/72 mmHg (11/06 0800) Pulse Rate: 125 (11/06 0900)  Labs:  Recent Labs  08/05/14 2326 08/06/14 0255 08/07/14 0300 08/07/14 1300 08/08/14 0318  HGB 14.9  --   --   --  14.6  HCT 46.1  --   --   --  45.1  PLT 95*  --   --   --  82*  LABPROT 31.7* 33.3* 28.5*  --  26.1*  INR 3.04* 3.24* 2.65*  --  2.37*  CREATININE 1.55* 1.46* 1.45* 1.36* 1.34  TROPONINI <0.30  --   --   --   --     Estimated Creatinine Clearance: 39.7 mL/min (by C-G formula based on Cr of 1.34).   Medical History: Past Medical History  Diagnosis Date  . Osteoarthrosis, unspecified whether generalized or localized, unspecified site   . Coronary atherosclerosis of unspecified type of vessel, native or graft   . Chronic rhinitis   . Chronic airway obstruction, not elsewhere classified   . Atrial fibrillation     chronic-coumadin , VVIR pacemaker-Dr. Alanda AmassWeintraub  . Gout     Dr Novella Robrulsow  . Chronic kidney disease     mild renal insufficiency  . Shortness of breath   . Hypertension   . Myocardial infarction   . Recurrent upper respiratory infection (URI)     cold 2 weeks ago  . Pneumonia     10 years ago  . Anemia   . H/O hiatal hernia   . Depression   . Angina     Medications:  Prescriptions prior to admission  Medication Sig Dispense Refill Last Dose  . aspirin EC 81 MG tablet Take 1 tablet (81 mg total) by mouth daily.   08/04/2014  . atorvastatin (LIPITOR) 20 MG tablet Take 20 mg by mouth daily.     08/04/2014  . cetirizine (ZYRTEC) 10 MG tablet Take 10 mg by mouth daily as needed for  allergies.   Past Week at Unknown time  . colchicine-probenecid 0.5-500 MG per tablet Take 1 tablet by mouth daily.    08/04/2014  . digoxin (LANOXIN) 0.125 MG tablet Take 0.0625 mg by mouth daily.   08/04/2014  . ferrous sulfate 325 (65 FE) MG EC tablet Take 325 mg by mouth daily with breakfast.   08/04/2014  . finasteride (PROSCAR) 5 MG tablet Take 5 mg by mouth daily.   08/04/2014  . fludrocortisone (FLORINEF) 0.1 MG tablet Take 0.1 mg by mouth daily.   08/04/2014  . furosemide (LASIX) 20 MG tablet Take 20 mg by mouth daily.    08/04/2014 at Unknown time  . lubiprostone (AMITIZA) 24 MCG capsule Take 24 mcg by mouth daily with breakfast.   08/04/2014  . metoprolol tartrate (LOPRESSOR) 25 MG tablet Take 12.5 mg by mouth daily.   Past Week at given at hospital   . potassium chloride (K-DUR) 10 MEQ tablet Take 10 mEq by mouth daily.   Past Week at Unknown time  . predniSONE (DELTASONE) 5 MG tablet Take 5 mg by mouth 2 (two) times daily with a meal. Pt currently only takes 5 mg  Past Week at Unknown time  . probenecid (BENEMID) 500 MG tablet Take 500 mg by mouth daily.    08/04/2014  . Tamsulosin HCl (FLOMAX) 0.4 MG CAPS Take 0.4 mg by mouth daily.    08/04/2014  . warfarin (COUMADIN) 2 MG tablet Take 2 mg by mouth daily.   08/04/2014  . albuterol (PROAIR HFA) 108 (90 BASE) MCG/ACT inhaler Inhale 2 puffs into the lungs 4 (four) times daily as needed. For shortness of breath   08/04/2014  . formoterol (FORADIL) 12 MCG capsule for inhaler Place 1 capsule (12 mcg total) into inhaler and inhale 2 (two) times daily. 60 capsule 3 Taking  . SYMBICORT 160-4.5 MCG/ACT inhaler Inhale 2 puffs into the lungs 2 (two) times daily.    08/04/2014    Assessment: 80 yo admitted 08/05/2014 with weakness.  Pharmacy consulted to dose warfarin.  PMH: chronic afib, CAD, severe COPD,   AC/Heme: Afib,  Home dose 3mg  daily INR at goal, CBC wnl, no bleeding noted  ID: possible CAP, CRX RLL airspace disease Azith 11/5>>  CV:  CAD, afib HR 100 -120s, afib, SBP 90-100 ASA, statin, dig (level 0.4) , dilt, lasix, KCl,  BP will not allow addition of CCB or BB now, dig dose increased  Renal: SrCr stable, CrCl ~36 ml/min flomax  Pulm: severe COPD, noncompliant with 02 at home Symbicort, prednisone  Goal of Therapy:  INR 2-3 Monitor platelets by anticoagulation protocol: Yes   Plan:  Coumadin 2 mg po today Daily PT/INR   Thank you for allowing pharmacy to be a part of this patients care team.  Lovenia KimJulie Curtistine Pettitt Pharm.D., BCPS, AQ-Cardiology Clinical Pharmacist 08/08/2014 10:17 AM Pager: [phone] Phone: [phone]

## 2014-08-08 NOTE — Care Management Note (Addendum)
    Page 1 of 1   08/15/2014     10:56:13 AM CARE MANAGEMENT NOTE 08/15/2014  Patient:  Karmen BongoBENNETT,Tanuj E   Account Number:  [id number]  Date Initiated:  08/08/2014  Documentation initiated by:  MAYO,HENRIETTA  Subjective/Objective Assessment:   dx AFib w/RVR; lives alone    PCP  Coralie KeensSamuel Newsome     Action/Plan:   Anticipated DC Date:     Anticipated DC Plan:    In-house referral  Clinical Social Worker      DC Planning Services  CM consult      Choice offered to / List presented to:             Status of service:  Completed, signed off Medicare Important Message given?  YES (If response is "NO", the following Medicare IM given date fields will be blank) Date Medicare IM given:  08/15/2014 Medicare IM given by:  Junius CreamerWELL,DEBBIE Date Additional Medicare IM given:  08/12/2014 Additional Medicare IM given by:  [facility]  Discharge Disposition:    Per UR Regulation:  Reviewed for med. necessity/level of care/duration of stay  If discussed at Long Length of Stay Meetings, dates discussed:   08/12/2014  08/14/2014    Comments:  08/12/14 0930 Henrietta Mayo RN MSN BSN CCM Discharge held due to somnolence and hypotension.  PCCM consult/ABGs.  Pt has bed when medically stable.  08/08/14 0956 Henrietta Mayo RN MSN BSN CCM PT recommends SNF for rehab.  Cardiologist discussed condition/plan with son who [facility] [place].  CSW notified.

## 2014-08-09 ENCOUNTER — Encounter (HOSPITAL_COMMUNITY): Payer: Self-pay | Admitting: Cardiology

## 2014-08-09 DIAGNOSIS — I5043 Acute on chronic combined systolic (congestive) and diastolic (congestive) heart failure: Secondary | ICD-10-CM | POA: Diagnosis present

## 2014-08-09 DIAGNOSIS — N183 Chronic kidney disease, stage 3 unspecified: Secondary | ICD-10-CM | POA: Diagnosis present

## 2014-08-09 DIAGNOSIS — I255 Ischemic cardiomyopathy: Secondary | ICD-10-CM | POA: Diagnosis present

## 2014-08-09 DIAGNOSIS — E875 Hyperkalemia: Secondary | ICD-10-CM | POA: Diagnosis present

## 2014-08-09 LAB — URINALYSIS, ROUTINE W REFLEX MICROSCOPIC
Bilirubin Urine: NEGATIVE
Glucose, UA: NEGATIVE mg/dL
Ketones, ur: NEGATIVE mg/dL
Leukocytes, UA: NEGATIVE
Nitrite: NEGATIVE
Protein, ur: 30 mg/dL — AB
Specific Gravity, Urine: 1.016 (ref 1.005–1.030)
Urobilinogen, UA: 0.2 mg/dL (ref 0.0–1.0)
pH: 6 (ref 5.0–8.0)

## 2014-08-09 LAB — PROTIME-INR
INR: 2.65 — ABNORMAL HIGH (ref 0.00–1.49)
PROTHROMBIN TIME: 28.5 s — AB (ref 11.6–15.2)

## 2014-08-09 LAB — URINE MICROSCOPIC-ADD ON

## 2014-08-09 MED ORDER — NEBIVOLOL HCL 5 MG PO TABS
5.0000 mg | ORAL_TABLET | Freq: Every day | ORAL | Status: DC
  Administered 2014-08-09 – 2014-08-10 (×2): 5 mg via ORAL
  Filled 2014-08-09 (×2): qty 1

## 2014-08-09 MED ORDER — WARFARIN SODIUM 2 MG PO TABS
2.0000 mg | ORAL_TABLET | Freq: Once | ORAL | Status: AC
Start: 2014-08-09 — End: 2014-08-09
  Administered 2014-08-09: 2 mg via ORAL
  Filled 2014-08-09: qty 1

## 2014-08-09 MED ORDER — WHITE PETROLATUM GEL
Status: AC
  Administered 2014-08-09: 0.2
  Filled 2014-08-09: qty 5

## 2014-08-09 MED ORDER — MIDODRINE HCL 5 MG PO TABS
5.0000 mg | ORAL_TABLET | Freq: Three times a day (TID) | ORAL | Status: DC
  Administered 2014-08-09 – 2014-08-15 (×20): 5 mg via ORAL
  Filled 2014-08-09 (×22): qty 1

## 2014-08-09 NOTE — Progress Notes (Signed)
    Subjective:  Weak SOB at rest  Objective:  Vital Signs in the last 24 hours: Temp:  [97.1 F (36.2 C)-98.8 F (37.1 C)] 97.1 F (36.2 C) (11/07 0737) Pulse Rate:  [39-131] 106 (11/07 0737) Resp:  [11-33] 24 (11/07 0737) BP: (89-114)/(46-83) 94/46 mmHg (11/07 0737) SpO2:  [88 %-100 %] 98 % (11/07 0737) Weight:  [155 lb 10.3 oz (70.6 kg)] 155 lb 10.3 oz (70.6 kg) (11/07 0425)  Intake/Output from previous day:  Intake/Output Summary (Last 24 hours) at 08/09/14 0827 Last data filed at 08/09/14 0528  Gross per 24 hour  Intake    446 ml  Output   1300 ml  Net   -854 ml    Physical Exam: General appearance: alert, cooperative, no distress, mildly obese, slowed mentation and chronically ill appearing Lungs: decreased breath sounds Heart: irregularly irregular rhythm   Rate: 115-125  Rhythm: atrial fibrillation and PVCs, short runs of NSVT  Lab Results:  Recent Labs  08/08/14 0318  WBC 8.0  HGB 14.6  PLT 82*    Recent Labs  08/07/14 1300 08/08/14 0318  NA 144 144  K 5.0 4.8  CL 104 103  CO2 31 30  GLUCOSE 70 119*  BUN 36* 36*  CREATININE 1.36* 1.34   No results for input(s): TROPONINI in the last 72 hours.  Invalid input(s): CK, MB  Recent Labs  08/09/14 0312  INR 2.65*    Imaging: Imaging results have been reviewed  Cardiac Studies:  Assessment/Plan:  Pt is an elderly gentleman who lives alone in [place] with advanced chronic lung disease on chronic O2 and steroids, who follows up with Dr. Jetty Duhamellinton Young. He has numerous cardiac and vascular problems that include coronary disease s/p LAD stent in 2000 with low risk Myoview 2013, carotid occlusion, abdominal aortic aneurysm, permanent atrial fibrillation on Coumadin, and a history of bradycardia for which he initially received a pacemaker in 1991 (last generator change 2010). He was transferred 11/03 from [place] with hypotension and rapid AF.   Principal Problem:   Atrial fibrillation with  RVR Active Problems:   Acute on chronic combined systolic and diastolic congestive heart failure   CAD S/P LAD stent 2000   Permanent atrial fibrillation   COPD mixed type   Chronic anticoagulation with Coumadin   Pacemaker - St. Jude Accent DR RF 2010 (initial pacemaker implant 1991), programmed VVIR   Orthostatic hypotension   CAP (community acquired pneumonia)   Chronic renal insufficiency, stage III (moderate)   AAA (abdominal aortic aneurysm)   Cardiomyopathy, ischemic- EF 45-50% Nov 2015    PLAN: No good options. His B/P remains low, his HR is still fast. Consider adding ProAmatine and try low dose beta blocker. MD to see Plan is for SNF at discharge. Consider NCB as well. Possible discharge Monday.  Corine ShelterLuke Kilroy PA-C Beeper [phone] 08/09/2014, 8:27 AM   Patient examined chart reviewed  Given fact that pacer is working normally can add low dose beta blocker and midodrine if needed  SNF appropriate   Charlton HawsPeter Jennice Renegar

## 2014-08-09 NOTE — Plan of Care (Signed)
Problem: Phase II Progression Outcomes Goal: Ventricular heart rate < 100/min Outcome: Progressing Goal: Anticoagulation Therapy per MD order Outcome: Progressing Goal: Pain controlled Outcome: Progressing Goal: Tolerating diet Outcome: Progressing

## 2014-08-09 NOTE — Progress Notes (Signed)
ANTICOAGULATION CONSULT NOTE - Follow Up Pharmacy Consult for coumadin Indication: atrial fibrillation  Allergies  Allergen Reactions  . Sulfonamide Derivatives     REACTION: Swelling of jaws and had welps    Patient Measurements: Height: 5\' 8"  (172.7 cm) Weight: 155 lb 10.3 oz (70.6 kg) IBW/kg (Calculated) : 68.4   Vital Signs: Temp: 98 F (36.7 C) (11/07 1100) Temp Source: Oral (11/07 1100) BP: 95/66 mmHg (11/07 1100) Pulse Rate: 119 (11/07 1100)  Labs:  Recent Labs  08/07/14 0300 08/07/14 1300 08/08/14 0318 08/09/14 0312  HGB  --   --  14.6  --   HCT  --   --  45.1  --   PLT  --   --  82*  --   LABPROT 28.5*  --  26.1* 28.5*  INR 2.65*  --  2.37* 2.65*  CREATININE 1.45* 1.36* 1.34  --     Estimated Creatinine Clearance: 39.7 mL/min (by C-G formula based on Cr of 1.34).   Medical History: Past Medical History  Diagnosis Date  . Osteoarthrosis, unspecified whether generalized or localized, unspecified site   . Coronary atherosclerosis of unspecified type of vessel, native or graft     LADstent 2000, low risk Nuc 2013  . Chronic rhinitis   . Chronic airway obstruction, not elsewhere classified     home O2, steroids  . Atrial fibrillation     chronic-coumadin , VVIR pacemaker-Dr. Alanda AmassWeintraub  . Gout     Dr Novella Robrulsow  . Chronic kidney disease     mild renal insufficiency  . Hypertension   . Myocardial infarction   . Pneumonia     RLL Nov 2015  . Anemia   . H/O hiatal hernia   . Depression   . SSS (sick sinus syndrome)     Pacemaker 1991, 2010  . AAA (abdominal aortic aneurysm)     4.1 x 4.03 Oct 2013  . Chronic anticoagulation     Coumadin  . Ischemic cardiomyopathy Nov 2015    EF 45-50% with RV dysfunction    Medications:  Prescriptions prior to admission  Medication Sig Dispense Refill Last Dose  . aspirin EC 81 MG tablet Take 1 tablet (81 mg total) by mouth daily.   08/04/2014  . atorvastatin (LIPITOR) 20 MG tablet Take 20 mg by mouth daily.      08/04/2014  . cetirizine (ZYRTEC) 10 MG tablet Take 10 mg by mouth daily as needed for allergies.   Past Week at Unknown time  . colchicine-probenecid 0.5-500 MG per tablet Take 1 tablet by mouth daily.    08/04/2014  . digoxin (LANOXIN) 0.125 MG tablet Take 0.0625 mg by mouth daily.   08/04/2014  . ferrous sulfate 325 (65 FE) MG EC tablet Take 325 mg by mouth daily with breakfast.   08/04/2014  . finasteride (PROSCAR) 5 MG tablet Take 5 mg by mouth daily.   08/04/2014  . fludrocortisone (FLORINEF) 0.1 MG tablet Take 0.1 mg by mouth daily.   08/04/2014  . furosemide (LASIX) 20 MG tablet Take 20 mg by mouth daily.    08/04/2014 at Unknown time  . lubiprostone (AMITIZA) 24 MCG capsule Take 24 mcg by mouth daily with breakfast.   08/04/2014  . metoprolol tartrate (LOPRESSOR) 25 MG tablet Take 12.5 mg by mouth daily.   Past Week at given at hospital   . potassium chloride (K-DUR) 10 MEQ tablet Take 10 mEq by mouth daily.   Past Week at Unknown time  . predniSONE (  DELTASONE) 5 MG tablet Take 5 mg by mouth 2 (two) times daily with a meal. Pt currently only takes 5 mg   Past Week at Unknown time  . probenecid (BENEMID) 500 MG tablet Take 500 mg by mouth daily.    08/04/2014  . Tamsulosin HCl (FLOMAX) 0.4 MG CAPS Take 0.4 mg by mouth daily.    08/04/2014  . warfarin (COUMADIN) 2 MG tablet Take 2 mg by mouth daily.   08/04/2014  . albuterol (PROAIR HFA) 108 (90 BASE) MCG/ACT inhaler Inhale 2 puffs into the lungs 4 (four) times daily as needed. For shortness of breath   08/04/2014  . formoterol (FORADIL) 12 MCG capsule for inhaler Place 1 capsule (12 mcg total) into inhaler and inhale 2 (two) times daily. 60 capsule 3 Taking  . SYMBICORT 160-4.5 MCG/ACT inhaler Inhale 2 puffs into the lungs 2 (two) times daily.    08/04/2014    Assessment: [age] yo admitted 08/05/2014 with weakness.  Pharmacy consulted to dose warfarin for AFib. INR therapeutic at 2.65. PTA dose: 2 mg po daily  Goal of Therapy:  INR 2-3 Monitor  platelets by anticoagulation protocol: Yes   Plan:  Coumadin 2 mg po x1 Daily PT/INR    Agapito GamesAlison Helena Sardo, PharmD, BCPS Clinical Pharmacist Pager: [phone] 08/09/2014 2:20 PM

## 2014-08-10 DIAGNOSIS — I2510 Atherosclerotic heart disease of native coronary artery without angina pectoris: Secondary | ICD-10-CM

## 2014-08-10 DIAGNOSIS — Z9861 Coronary angioplasty status: Secondary | ICD-10-CM

## 2014-08-10 LAB — BASIC METABOLIC PANEL
Anion gap: 6 (ref 5–15)
BUN: 25 mg/dL — ABNORMAL HIGH (ref 6–23)
CO2: 39 mEq/L — ABNORMAL HIGH (ref 19–32)
Calcium: 8.1 mg/dL — ABNORMAL LOW (ref 8.4–10.5)
Chloride: 100 mEq/L (ref 96–112)
Creatinine, Ser: 1.22 mg/dL (ref 0.50–1.35)
GFR calc Af Amer: 61 mL/min — ABNORMAL LOW (ref >90)
GFR calc non Af Amer: 53 mL/min — ABNORMAL LOW (ref >90)
Glucose, Bld: 156 mg/dL — ABNORMAL HIGH (ref 70–99)
Potassium: 3.9 mEq/L (ref 3.7–5.3)
Sodium: 145 mEq/L (ref 137–147)

## 2014-08-10 LAB — PROTIME-INR
INR: 2.62 — ABNORMAL HIGH (ref 0.00–1.49)
Prothrombin Time: 28.2 seconds — ABNORMAL HIGH (ref 11.6–15.2)

## 2014-08-10 MED ORDER — WARFARIN SODIUM 2 MG PO TABS
2.0000 mg | ORAL_TABLET | Freq: Once | ORAL | Status: AC
  Administered 2014-08-10: 2 mg via ORAL
  Filled 2014-08-10: qty 1

## 2014-08-10 MED ORDER — AMIODARONE HCL 200 MG PO TABS
200.0000 mg | ORAL_TABLET | Freq: Every day | ORAL | Status: DC
  Administered 2014-08-10 – 2014-08-15 (×6): 200 mg via ORAL
  Filled 2014-08-10 (×6): qty 1

## 2014-08-10 MED ORDER — NEBIVOLOL HCL 5 MG PO TABS
5.0000 mg | ORAL_TABLET | Freq: Every day | ORAL | Status: DC
  Administered 2014-08-13: 5 mg via ORAL
  Filled 2014-08-10 (×5): qty 1

## 2014-08-10 MED ORDER — CHLORHEXIDINE GLUCONATE 0.12 % MT SOLN
15.0000 mL | Freq: Two times a day (BID) | OROMUCOSAL | Status: DC
  Administered 2014-08-10 – 2014-08-15 (×9): 15 mL via OROMUCOSAL
  Filled 2014-08-10 (×13): qty 15

## 2014-08-10 MED ORDER — CETYLPYRIDINIUM CHLORIDE 0.05 % MT LIQD: 7.0000 mL | Freq: Two times a day (BID) | OROMUCOSAL | Status: DC

## 2014-08-10 NOTE — Progress Notes (Signed)
ANTICOAGULATION CONSULT NOTE  Pharmacy Consult for coumadin Indication: atrial fibrillation  Allergies  Allergen Reactions  . Sulfonamide Derivatives     REACTION: Swelling of jaws and had welps    Patient Measurements: Height: 5\' 8"  (172.7 cm) Weight: 158 lb 1.1 oz (71.7 kg) IBW/kg (Calculated) : 68.4   Vital Signs: Temp: 98.2 F (36.8 C) (11/08 0736) Temp Source: Oral (11/08 0736) BP: 99/68 mmHg (11/08 0841) Pulse Rate: 93 (11/08 1018)  Labs:  Recent Labs  08/07/14 1300 08/08/14 0318 08/09/14 0312 08/10/14 0250  HGB  --  14.6  --   --   HCT  --  45.1  --   --   PLT  --  82*  --   --   LABPROT  --  26.1* 28.5* 28.2*  INR  --  2.37* 2.65* 2.62*  CREATININE 1.36* 1.34  --  1.22    Estimated Creatinine Clearance: 43.6 mL/min (by C-G formula based on Cr of 1.22).   Medical History: Past Medical History  Diagnosis Date  . Osteoarthrosis, unspecified whether generalized or localized, unspecified site   . Coronary atherosclerosis of unspecified type of vessel, native or graft     LADstent 2000, low risk Nuc 2013  . Chronic rhinitis   . Chronic airway obstruction, not elsewhere classified     home O2, steroids  . Atrial fibrillation     chronic-coumadin , VVIR pacemaker-Dr. Alanda AmassWeintraub  . Gout     Dr Novella Robrulsow  . Chronic kidney disease     mild renal insufficiency  . Hypertension   . Myocardial infarction   . Pneumonia     RLL Nov 2015  . Anemia   . H/O hiatal hernia   . Depression   . SSS (sick sinus syndrome)     Pacemaker 1991, 2010  . AAA (abdominal aortic aneurysm)     4.1 x 4.03 Oct 2013  . Chronic anticoagulation     Coumadin  . Ischemic cardiomyopathy Nov 2015    EF 45-50% with RV dysfunction    Medications:  Prescriptions prior to admission  Medication Sig Dispense Refill Last Dose  . aspirin EC 81 MG tablet Take 1 tablet (81 mg total) by mouth daily.   08/04/2014  . atorvastatin (LIPITOR) 20 MG tablet Take 20 mg by mouth daily.     08/04/2014   . cetirizine (ZYRTEC) 10 MG tablet Take 10 mg by mouth daily as needed for allergies.   Past Week at Unknown time  . colchicine-probenecid 0.5-500 MG per tablet Take 1 tablet by mouth daily.    08/04/2014  . digoxin (LANOXIN) 0.125 MG tablet Take 0.0625 mg by mouth daily.   08/04/2014  . ferrous sulfate 325 (65 FE) MG EC tablet Take 325 mg by mouth daily with breakfast.   08/04/2014  . finasteride (PROSCAR) 5 MG tablet Take 5 mg by mouth daily.   08/04/2014  . fludrocortisone (FLORINEF) 0.1 MG tablet Take 0.1 mg by mouth daily.   08/04/2014  . furosemide (LASIX) 20 MG tablet Take 20 mg by mouth daily.    08/04/2014 at Unknown time  . lubiprostone (AMITIZA) 24 MCG capsule Take 24 mcg by mouth daily with breakfast.   08/04/2014  . metoprolol tartrate (LOPRESSOR) 25 MG tablet Take 12.5 mg by mouth daily.   Past Week at given at hospital   . potassium chloride (K-DUR) 10 MEQ tablet Take 10 mEq by mouth daily.   Past Week at Unknown time  . predniSONE (DELTASONE) 5  MG tablet Take 5 mg by mouth 2 (two) times daily with a meal. Pt currently only takes 5 mg   Past Week at Unknown time  . probenecid (BENEMID) 500 MG tablet Take 500 mg by mouth daily.    08/04/2014  . Tamsulosin HCl (FLOMAX) 0.4 MG CAPS Take 0.4 mg by mouth daily.    08/04/2014  . warfarin (COUMADIN) 2 MG tablet Take 2 mg by mouth daily.   08/04/2014  . albuterol (PROAIR HFA) 108 (90 BASE) MCG/ACT inhaler Inhale 2 puffs into the lungs 4 (four) times daily as needed. For shortness of breath   08/04/2014  . formoterol (FORADIL) 12 MCG capsule for inhaler Place 1 capsule (12 mcg total) into inhaler and inhale 2 (two) times daily. 60 capsule 3 Taking  . SYMBICORT 160-4.5 MCG/ACT inhaler Inhale 2 puffs into the lungs 2 (two) times daily.    08/04/2014    Assessment: 80 yo admitted 08/05/2014 with weakness.  Pharmacy consulted to dose warfarin for AFib. INR therapeutic at 2.65. PTA dose: 2 mg po daily  Goal of Therapy:  INR 2-3 Monitor platelets by  anticoagulation protocol: Yes   Plan:  Coumadin 2 mg po x1 Daily PT/INR    Agapito GamesAlison Laramie Meissner, PharmD, BCPS Clinical Pharmacist Pager: [phone] 08/10/2014 11:04 AM

## 2014-08-10 NOTE — Plan of Care (Signed)
Problem: Phase I Progression Outcomes Goal: Ventricular heart rate < 120/min Outcome: Progressing     

## 2014-08-10 NOTE — Progress Notes (Signed)
PA made aware of decrease in systolic BP.  Vitals are documented in Epic.  No additional orders given at this time, nurse will continue to monitor.

## 2014-08-10 NOTE — Progress Notes (Addendum)
Patient Name: Willie [name] Date of Encounter: 08/10/2014  Principal Problem:   Atrial fibrillation with RVR Active Problems:   CAD S/P LAD stent 2000   Permanent atrial fibrillation   COPD mixed type   Chronic anticoagulation with Coumadin   Pacemaker - St. Jude Accent DR RF 2010 (initial pacemaker implant 1991), programmed VVIR   Orthostatic hypotension   AAA (abdominal aortic aneurysm)   CAP (community acquired pneumonia)   Chronic renal insufficiency, stage III (moderate)   Cardiomyopathy, ischemic- EF 45-50% Nov 2015   Acute on chronic combined systolic and diastolic congestive heart failure   Hyperkalemia   Primary Cardiologist: MCr  Patient Profile: 80 yo male w/ hx perm afib on coumadin, St Jude PPM, LAD stent 2000, COPD on home O2, admitted 11/03 w/ ?bradycardia, mainly has RVR. PPM working fine. SNF placement recommended.  SUBJECTIVE: Feels OK, says breathing at baseline. Not eating much, not hungry  OBJECTIVE Filed Vitals:   08/10/14 0429 08/10/14 0736 08/10/14 0823 08/10/14 0841  BP:  92/51  99/68  Pulse:  105 91 99  Temp:  98.2 F (36.8 C)    TempSrc:  Oral    Resp:  24 24 22   Height:      Weight:      SpO2: 97% 98%  99%    Intake/Output Summary (Last 24 hours) at 08/10/14 0906 Last data filed at 08/10/14 0018  Gross per 24 hour  Intake    360 ml  Output   1100 ml  Net   -740 ml   Filed Weights   08/08/14 0500 08/09/14 0425 08/10/14 0355  Weight: 157 lb (71.215 kg) 155 lb 10.3 oz (70.6 kg) 158 lb 1.1 oz (71.7 kg)    PHYSICAL EXAM General: Well developed, well nourished, male in no acute distress. Head: Normocephalic, atraumatic.  Neck: Supple without bruits, JVD minimal elevation. Lungs:  Resp regular and unlabored, decreased BS bilaterally, no wheezing noted. Heart: rapid irreg, S1, S2, no S3, S4, or murmur; no rub. Abdomen: Soft, non-tender, non-distended, BS + x 4.  Extremities: No clubbing, cyanosis, no edema.  Neuro: Alert and  oriented X 3. Moves all extremities spontaneously. Psych: Normal affect.  LABS: CBC: Recent Labs  08/08/14 0318  WBC 8.0  NEUTROABS 6.5  HGB 14.6  HCT 45.1  MCV 113.3*  PLT 82*   INR: Recent Labs  08/10/14 0250  INR 2.62*   Basic Metabolic Panel: Recent Labs  08/08/14 0318 08/10/14 0250  NA 144 145  K 4.8 3.9  CL 103 100  CO2 30 39*  GLUCOSE 119* 156*  BUN 36* 25*  CREATININE 1.34 1.22  CALCIUM 8.2* 8.1*   BNP: PRO B NATRIURETIC PEPTIDE (BNP)  Date/Time Value Ref Range Status  08/08/2014 03:18 AM 5097.0* 0 - 450 pg/mL Final  08/05/2014 11:26 PM 3935.0* 0 - 450 pg/mL Final   TELE:  Atrial fib, RVR most of the time.  Current Medications:  . antiseptic oral rinse  7 mL Mouth Rinse BID  . aspirin EC  81 mg Oral Daily  . atorvastatin  20 mg Oral Daily  . azithromycin  250 mg Oral Daily  . budesonide-formoterol  2 puff Inhalation BID  . digoxin  0.125 mg Oral Daily  . furosemide  40 mg Oral Daily  . midodrine  5 mg Oral TID WC  . nebivolol  5 mg Oral Daily  . predniSONE  5 mg Oral BID WC  . sodium chloride  3 mL  Intravenous Q12H  . tamsulosin  0.4 mg Oral QPC supper  . Warfarin - Pharmacist Dosing Inpatient   Does not apply q1800      ASSESSMENT AND PLAN: Principal Problem:   Atrial fibrillation with RVR - poor rate control on digoxin, Bystolic 5 mg with midodrine 5 mg TID added 11/07, not much improvement. Only option that BP may tolerate is amio. MD advise med changes.  Active Problems:   CAD S/P LAD stent 2000 - no acute ischemic sx, continue ASA, BB, statin    Permanent atrial fibrillation - see above    COPD mixed type - on home O2 and home inhaler and oral steroids, but has another steroid inhaler and albuterol inhaler he is not currently on. MD advise if he is to resume these at d/c    Chronic anticoagulation with Coumadin - INR therapeutic    Pacemaker - St. Jude Accent DR RF 2010 (initial pacemaker implant 1991), programmed VVIR -  working    Orthostatic hypotension - midodrine added 11/07    AAA (abdominal aortic aneurysm) - follow    CAP (community acquired pneumonia) - on ABX, recheck CXR in am to assure clearing    Chronic renal insufficiency, stage III (moderate) - follow, Lasix 20 mg qd PTA, now on 40 mg daily    Cardiomyopathy, ischemic- EF 45-50% Nov 2015 - see above, follow weights    Acute on chronic combined systolic and diastolic congestive heart failure - see above     Hyperkalemia - improved    Thrombocytopenia - no old labs in system, Plt 95 on admit, follow, watch for bleeding issues.   Plan - d/c to SNF, MD advise if will be ready in am.  Signed, Theodore Demarkhonda Barrett , PA-C 9:06 AM 08/10/2014  The patient was seen, examined and discussed with Theodore Demarkhonda Barrett, PA-C and I agree with the above.   80 year old male with [name]/o CAD, now with permanent a-fibrillation and RVR, HR improved, however still in 85-110 range with CHF and very soft BP not allowing further BB titration. We will add a low dose amiodarone 200 mg po daily without loading. CHF improved - he appears euvolemic, creatinine is stable. Discharge to SNF in the am.   Willie [name], Willie [name] 08/10/2014

## 2014-08-11 ENCOUNTER — Inpatient Hospital Stay (HOSPITAL_COMMUNITY): Payer: Medicare Other

## 2014-08-11 DIAGNOSIS — I255 Ischemic cardiomyopathy: Secondary | ICD-10-CM

## 2014-08-11 DIAGNOSIS — Z7901 Long term (current) use of anticoagulants: Secondary | ICD-10-CM

## 2014-08-11 DIAGNOSIS — N189 Chronic kidney disease, unspecified: Secondary | ICD-10-CM

## 2014-08-11 LAB — MDC_IDC_ENUM_SESS_TYPE_REMOTE
Battery Voltage: 2.96 V
Brady Statistic RV Percent Paced: 21 %
Lead Channel Impedance Value: 510 Ohm
Lead Channel Sensing Intrinsic Amplitude: 8.6 mV
Lead Channel Setting Pacing Amplitude: 2.25 V
Lead Channel Setting Pacing Pulse Width: 0.4 ms
MDC IDC MSMT BATTERY REMAINING LONGEVITY: 109 mo
MDC IDC MSMT BATTERY REMAINING PERCENTAGE: 82 %
MDC IDC MSMT LEADCHNL RV PACING THRESHOLD AMPLITUDE: 0.75 V
MDC IDC MSMT LEADCHNL RV PACING THRESHOLD PULSEWIDTH: 0.4 ms
MDC IDC PG SERIAL: 7078932
MDC IDC SESS DTM: 20151029080029
MDC IDC SET LEADCHNL RV SENSING SENSITIVITY: 2 mV

## 2014-08-11 LAB — PROTIME-INR
INR: 2.77 — ABNORMAL HIGH (ref 0.00–1.49)
Prothrombin Time: 29.4 seconds — ABNORMAL HIGH (ref 11.6–15.2)

## 2014-08-11 MED ORDER — WARFARIN SODIUM 2 MG PO TABS
2.0000 mg | ORAL_TABLET | Freq: Once | ORAL | Status: AC
  Administered 2014-08-11: 2 mg via ORAL
  Filled 2014-08-11: qty 1

## 2014-08-11 NOTE — Progress Notes (Signed)
Pt's afib is now rate contolled with a rate 62-84, but hypotension remains an issue.  BP now on monitor and confirmed manually is 75/52.  Pt remains sleepy, but easily arousable and answers questions appropriately when awake. He denies pain and sob. 12 lead EKG obtained as PVCs, showed t wave abnormality, but otherwise unchanged. Dr. Donnie Ahoilley was notified of above, advised to continue to monitor.

## 2014-08-11 NOTE — Progress Notes (Signed)
Physical Therapy Treatment Patient Details Name: Willie [name] MRN: [id number] DOB: [birth year] Today's Date: 08/11/2014    History of Present Illness 80 y.o. male w/ PMHx significant for chronic afib and bradycardia requiring pacemaker, CAD s/p LAD stent 2000, severe COPD on home 02,  [facility] of [facility] and SOB and was subsequent [facility]2014 due to concerns of pacer failure.    PT Comments    Patient agreeable to OOB to chair this session, performed some basic mobility activities and educated patient regarding LE there ex. Patient continues to require assist for mobility. Current POC remains appropriate.  Follow Up Recommendations  SNF;Supervision/Assistance - 24 hour     Equipment Recommendations  Other (comment) (TBD)    Recommendations for Other Services       Precautions / Restrictions Precautions Precautions: Fall Restrictions Weight Bearing Restrictions: No    Mobility  Bed Mobility Overal bed mobility: Needs Assistance Bed Mobility: Supine to Sit;Sit to Supine     Supine to sit: Mod assist Sit to supine: Min assist   General bed mobility comments: Moderate assist to pull to upright, difficulty repositioning and coming to EOB secondary to weakness.  Transfers Overall transfer level: Needs assistance Equipment used: Rolling walker (2 wheeled) Transfers: Sit to/from [employer]Stand;Stand Pivot Transfers Sit to Stand: Min assist Stand pivot transfers: Min assist       General transfer comment: VCs for hand placement and safety with mobility.    Ambulation/Gait Ambulation/Gait assistance: Min assist Ambulation Distance (Feet): 8 Feet Assistive device: Rolling walker (2 wheeled) Gait Pattern/deviations: Step-to pattern;Decreased stride length;Shuffle;Trunk flexed;Narrow base of support Gait velocity: decreased   General Gait Details: patient agreeable to OOB to chair   Stairs             Wheelchair Mobility    Modified Rankin (Stroke Patients Only)       Balance     Sitting balance-Leahy Scale: Fair       Standing balance-Leahy Scale: Poor                      Cognition Arousal/Alertness: Awake/alert Behavior During Therapy: Flat affect Overall Cognitive Status: No family/caregiver present to determine baseline cognitive functioning Area of Impairment: Orientation;Attention;Awareness;Problem solving;Memory Orientation Level: Disoriented to;Time;Situation;Place Current Attention Level: Sustained Memory: Decreased short-term memory     Awareness: Intellectual Problem Solving: Slow processing;Decreased initiation;Difficulty sequencing;Requires verbal cues;Requires tactile cues General Comments: patient initially very lethargic, but arousable and agreeable to OOB to chair activities this session.     Exercises      General Comments        Pertinent Vitals/Pain Faces Pain Scale: No hurt    Home Living                      Prior Function            PT Goals (current goals can now be found in the care plan section) Acute Rehab PT Goals Patient Stated Goal: to get stronger PT Goal Formulation: With patient Time For Goal Achievement: 2014-09-09 Potential to Achieve Goals: Fair Progress towards PT goals: Progressing toward goals    Frequency  Min 2X/week    PT Plan Current plan remains appropriate    Co-evaluation             End of Session Equipment Utilized During Treatment: Gait belt;Oxygen Activity Tolerance: Patient limited by fatigue Patient left: in chair;with  call bell/phone within reach     Time: 1152-1210 PT Time Calculation (min): 18 min  Charges:  $Therapeutic Activity: 8-22 mins                    G CodesFabio [name]:      Willie [name] Willie [name] 08/11/2014, 1:12 PM Charlotte Crumbevon Ashland Wiseman, PT DPT  [phone]

## 2014-08-11 NOTE — Clinical Social Work Note (Signed)
CSW spoke with R[facility]ennifer [facility] [place]- [place] are [place] to [place] patient at [place].  CSW will continue to follow.  Merlyn LotJenna Holoman, LCSWA Clinical Social Worker [phone]

## 2014-08-11 NOTE — Progress Notes (Signed)
ANTICOAGULATION CONSULT NOTE  Pharmacy Consult for coumadin Indication: atrial fibrillation  Allergies  Allergen Reactions  . Sulfonamide Derivatives     REACTION: Swelling of jaws and had welps    Patient Measurements: Height: 5\' 8"  (172.7 cm) Weight: 158 lb 15.2 oz (72.1 kg) IBW/kg (Calculated) : 68.4   Vital Signs: Temp: 98 F (36.7 C) (11/09 1141) Temp Source: Oral (11/09 1141) BP: 99/56 mmHg (11/09 1141) Pulse Rate: 52 (11/09 1141)  Labs:  Recent Labs  08/09/14 0312 08/10/14 0250 08/11/14 0320  LABPROT 28.5* 28.2* 29.4*  INR 2.65* 2.62* 2.77*  CREATININE  --  1.22  --     Estimated Creatinine Clearance: 43.6 mL/min (by C-G formula based on Cr of 1.22).  Assessment: 80 yo admitted 08/05/2014 with weakness.  Pharmacy consulted to dose warfarin for AFib. INR therapeutic at 2.65. PTA dose: 2 mg po daily  Goal of Therapy:  INR 2-3 Monitor platelets by anticoagulation protocol: Yes   Plan:  Coumadin 2 mg po x1 Daily PT/INR   Willie CyphersLorie Lyell [name], BS Pharm D, BCPS Clinical Pharmacist 08/11/2014 12:02 PM

## 2014-08-11 NOTE — Progress Notes (Signed)
     SUBJECTIVE: Pt has no complaints.   BP 85/58 mmHg  Pulse 47  Temp(Src) 97 F (36.1 C) (Axillary)  Resp 15  Ht 5\' 8"  (1.727 m)  Wt 158 lb 15.2 oz (72.1 kg)  BMI 24.17 kg/m2  SpO2 100%  Intake/Output Summary (Last 24 hours) at 08/11/14 0826 Last data filed at 08/11/14 0325  Gross per 24 hour  Intake    483 ml  Output    575 ml  Net    -92 ml    PHYSICAL EXAM General: Elderly, cachectic. Alert and oriented.   Psych:  Good affect, responds appropriately Neck: No JVD. No masses noted.  Lungs: Clear bilaterally with no wheezes or rhonci noted.  Heart: irreg irreg with no murmurs noted. Abdomen: Bowel sounds are present. Soft, non-tender.  Extremities: No lower extremity edema.   LABS: Basic Metabolic Panel:  Recent Labs  82/95/6210/05/17 0250  NA 145  K 3.9  CL 100  CO2 39*  GLUCOSE 156*  BUN 25*  CREATININE 1.22  CALCIUM 8.1*    Current Meds: . amiodarone  200 mg Oral Daily  . antiseptic oral rinse  7 mL Mouth Rinse BID  . aspirin EC  81 mg Oral Daily  . atorvastatin  20 mg Oral Daily  . azithromycin  250 mg Oral Daily  . budesonide-formoterol  2 puff Inhalation BID  . chlorhexidine  15 mL Mouth Rinse BID  . digoxin  0.125 mg Oral Daily  . furosemide  40 mg Oral Daily  . midodrine  5 mg Oral TID WC  . nebivolol  5 mg Oral Daily  . predniSONE  5 mg Oral BID WC  . sodium chloride  3 mL Intravenous Q12H  . tamsulosin  0.4 mg Oral QPC supper  . Warfarin - Pharmacist Dosing Inpatient   Does not apply q1800     ASSESSMENT AND PLAN:  1. CAD S/P LAD stent 2000: Stable. Continue ASA, beta blocker, statin  2. Permanent atrial fibrillation: amiodarone added yesterday. Continue beta blocker and digoxin.   3. Chronic anticoagulation with Coumadin - INR therapeutic at 2.77 this am.   4. Pacemaker - St. Jude Accent DR RF 2010 (initial pacemaker implant 1991), programmed VVIR - working well  5. Orthostatic hypotension - midodrine added 11/07. BP still low over the  weekend  6. CAP (community acquired pneumonia) - on ABX, recheck CXR today  7. Chronic renal insufficiency, stage III (moderate): renal function is stable.   8. Cardiomyopathy, ischemic- EF 45-50%   9. Acute on chronic combined systolic and diastolic congestive heart failure: volume status ok today. Lasix now at 40 mg per day.   Dispo: will check chest x-ray today. Follow BP for one more day. I do not feel comfortable discharging to SNF today. Possible d/c tomorrow.    Willie [name]  11/9/20158:26 AM

## 2014-08-12 DIAGNOSIS — I480 Paroxysmal atrial fibrillation: Secondary | ICD-10-CM

## 2014-08-12 DIAGNOSIS — I5043 Acute on chronic combined systolic (congestive) and diastolic (congestive) heart failure: Secondary | ICD-10-CM

## 2014-08-12 DIAGNOSIS — J9602 Acute respiratory failure with hypercapnia: Secondary | ICD-10-CM | POA: Insufficient documentation

## 2014-08-12 DIAGNOSIS — J189 Pneumonia, unspecified organism: Secondary | ICD-10-CM | POA: Insufficient documentation

## 2014-08-12 LAB — BLOOD GAS, ARTERIAL
ACID-BASE EXCESS: 14.8 mmol/L — AB (ref 0.0–2.0)
Bicarbonate: 40.8 mEq/L — ABNORMAL HIGH (ref 20.0–24.0)
DRAWN BY: 20517
O2 Content: 2 L/min
O2 SAT: 96.9 %
PCO2 ART: 73.9 mmHg — AB (ref 35.0–45.0)
Patient temperature: 98.6
TCO2: 43.1 mmol/L (ref 0–100)
pH, Arterial: 7.362 (ref 7.350–7.450)
pO2, Arterial: 89.1 mmHg (ref 80.0–100.0)

## 2014-08-12 LAB — BASIC METABOLIC PANEL
Anion gap: 5 (ref 5–15)
BUN: 33 mg/dL — AB (ref 6–23)
CHLORIDE: 100 meq/L (ref 96–112)
CO2: 40 meq/L — AB (ref 19–32)
Calcium: 8.4 mg/dL (ref 8.4–10.5)
Creatinine, Ser: 1.17 mg/dL (ref 0.50–1.35)
GFR calc non Af Amer: 55 mL/min — ABNORMAL LOW (ref >90)
GFR, EST AFRICAN AMERICAN: 64 mL/min — AB (ref >90)
Glucose, Bld: 123 mg/dL — ABNORMAL HIGH (ref 70–99)
POTASSIUM: 4.3 meq/L (ref 3.7–5.3)
Sodium: 145 mEq/L (ref 137–147)

## 2014-08-12 LAB — PROTIME-INR
INR: 2.97 — ABNORMAL HIGH (ref 0.00–1.49)
Prothrombin Time: 31.1 seconds — ABNORMAL HIGH (ref 11.6–15.2)

## 2014-08-12 LAB — GLUCOSE, CAPILLARY: Glucose-Capillary: 305 mg/dL — ABNORMAL HIGH (ref 70–99)

## 2014-08-12 MED ORDER — LEVALBUTEROL HCL 0.63 MG/3ML IN NEBU
0.6300 mg | INHALATION_SOLUTION | RESPIRATORY_TRACT | Status: DC | PRN
  Administered 2014-08-12 (×2): 0.63 mg via RESPIRATORY_TRACT
  Filled 2014-08-12 (×2): qty 3

## 2014-08-12 MED ORDER — PREDNISONE 20 MG PO TABS
40.0000 mg | ORAL_TABLET | Freq: Two times a day (BID) | ORAL | Status: DC
  Administered 2014-08-12 – 2014-08-14 (×4): 40 mg via ORAL
  Filled 2014-08-12 (×6): qty 2

## 2014-08-12 MED ORDER — FUROSEMIDE 40 MG PO TABS
40.0000 mg | ORAL_TABLET | Freq: Two times a day (BID) | ORAL | Status: DC
  Administered 2014-08-12 – 2014-08-15 (×6): 40 mg via ORAL
  Filled 2014-08-12 (×8): qty 1

## 2014-08-12 MED ORDER — WARFARIN SODIUM 1 MG PO TABS
1.0000 mg | ORAL_TABLET | Freq: Once | ORAL | Status: DC
  Filled 2014-08-12: qty 1

## 2014-08-12 MED ORDER — PREDNISONE 20 MG PO TABS
20.0000 mg | ORAL_TABLET | Freq: Two times a day (BID) | ORAL | Status: DC
  Filled 2014-08-12 (×2): qty 1

## 2014-08-12 NOTE — Progress Notes (Signed)
CRITICAL VALUE ALERT  Critical value received: CO2 40  Date of notification:  08/12/2014  Time of notification:  0540  Critical value read back: yes  Nurse who received alert:  Nickolas MadridLetitia Shalom Mcguiness, RN  MD notified (1st page):   Time of first page:  [phone]  MD notified (2nd page):  Time of second page:  Responding MD:  Azalee CourseHao Meng, PA  Time MD responded: [phone]

## 2014-08-12 NOTE — Progress Notes (Addendum)
ANTICOAGULATION CONSULT NOTE  Pharmacy Consult for coumadin Indication: atrial fibrillation  Allergies  Allergen Reactions  . Sulfonamide Derivatives     REACTION: Swelling of jaws and had welps    Patient Measurements: Height: 5\' 8"  (172.7 cm) Weight: 162 lb 0.6 oz (73.5 kg) IBW/kg (Calculated) : 68.4   Vital Signs: Temp: 98.1 F (36.7 C) (11/10 1319) Temp Source: Oral (11/10 1319) BP: 93/52 mmHg (11/10 1319) Pulse Rate: 99 (11/10 1319)  Labs:  Recent Labs  08/10/14 0250 08/11/14 0320 08/12/14 0354  LABPROT 28.2* 29.4* 31.1*  INR 2.62* 2.77* 2.97*  CREATININE 1.22  --  1.17    Estimated Creatinine Clearance: 45.5 mL/min (by C-G formula based on Cr of 1.17).  Assessment: 80 yo admitted 08/05/2014 with weakness.  Pharmacy consulted to dose warfarin for AFib.  INR up to 2.97 today - upward trend on home dose. Pt also with new amio this admit which can potentiate effects of warfarin. CBC wnl, no bleeding noted PTA dose 2mg  daily  Goal of Therapy:  INR 2-3 Monitor platelets by anticoagulation protocol: Yes   Plan:  No coumadin today -likely will need mix of 1 and 2mg  when d/c home Daily PT/INR  Christoper Fabianaron Stephaie Dardis, PharmD, BCPS Clinical pharmacist, pager [phone] 08/12/2014 1:37 PM

## 2014-08-12 NOTE — Progress Notes (Signed)
ABG result relayed to Dr. Clifton JamesMcAlhany.

## 2014-08-12 NOTE — Plan of Care (Signed)
Problem: Phase I Progression Outcomes Goal: Voiding-avoid urinary catheter unless indicated Outcome: Progressing     

## 2014-08-12 NOTE — Progress Notes (Addendum)
SUBJECTIVE: Pt denies any SOB, chest pain. Somnolence reported by nursing staff.   BP 108/68 mmHg  Pulse 101  Temp(Src) 97.9 F (36.6 C) (Oral)  Resp 25  Ht 5\' 8"  (1.727 m)  Wt 162 lb 0.6 oz (73.5 kg)  BMI 24.64 kg/m2  SpO2 89%  Intake/Output Summary (Last 24 hours) at 08/12/14 1123 Last data filed at 08/12/14 1000  Gross per 24 hour  Intake    360 ml  Output    100 ml  Net    260 ml    PHYSICAL EXAM General: Elderly, cachectic. Alert and oriented but drifts off to sleep during conversation.  Psych: Good affect, responds appropriately Neck: No JVD. No masses noted.  Lungs: Clear bilaterally with no wheezes or rhonci noted.  Heart: irreg irreg with no murmurs noted. Abdomen: Bowel sounds are present. Soft, non-tender.  Extremities: No lower extremity edema.  LABS: Basic Metabolic Panel:  Recent Labs  16/07/9610/08/15 0250 08/12/14 0354  NA 145 145  K 3.9 4.3  CL 100 100  CO2 39* 40*  GLUCOSE 156* 123*  BUN 25* 33*  CREATININE 1.22 1.17  CALCIUM 8.1* 8.4   Current Meds: . amiodarone  200 mg Oral Daily  . antiseptic oral rinse  7 mL Mouth Rinse BID  . aspirin EC  81 mg Oral Daily  . atorvastatin  20 mg Oral Daily  . budesonide-formoterol  2 puff Inhalation BID  . chlorhexidine  15 mL Mouth Rinse BID  . digoxin  0.125 mg Oral Daily  . furosemide  40 mg Oral Daily  . midodrine  5 mg Oral TID WC  . nebivolol  5 mg Oral Daily  . predniSONE  5 mg Oral BID WC  . sodium chloride  3 mL Intravenous Q12H  . tamsulosin  0.4 mg Oral QPC supper  . Warfarin - Pharmacist Dosing Inpatient   Does not apply q1800   Chest x-ray 08/11/14: Cardiac shadow remains enlarged. A pacing device is again seen and stable. Aortic calcifications are noted. Some interval clearing in the right lung base is noted. Persistent interstitial changes are seen. No focal confluent infiltrate is noted. A small pleural effusion is seen.  IMPRESSION: Interval improvement in right basilar  changes. No new focal abnormality is seen.  ASSESSMENT AND PLAN:  1. CAD S/P LAD stent 2000: Stable. Continue ASA, beta blocker, statin  2. Permanent atrial fibrillation: Rate controlled with amiodarone, beta blocker and digoxin. BP still soft at times   3. Chronic anticoagulation with Coumadin - INR therapeutic at 2.97 this am.   4. Pacemaker - St. Jude Accent DR RF 2010 (initial pacemaker implant 1991), working well  5. Orthostatic hypotension - midodrine added 11/07. BP stable  6. Chronic renal insufficiency, stage III (moderate): renal function is stable.   7. Cardiomyopathy, ischemic- EF 45-50%   8. Acute on chronic combined systolic and diastolic congestive heart failure: volume status ok today. Lasix now at 40 mg per day.   9. COPD/Somnolence: CO2 40 on BMET. Will get ABG. Pt has been somnolent last 12 hours. Known COPD and followed as an outpatient in pulmonary office by Dr. Maple HudsonYoung. May need to involve pulmonary. He may benefit from bipap. He had been on azithromycin but no clinical evidence of pneumonia.     Addendum 12:52pm: ABG with ph 7.36, PCO2 74, PO2 89. I have spoken to PCCM and they have agreed to see him today. He has known severe COPD and is on home  O2 but non-compliant per records. This may be his baseline but appreciate pulmonary consult and assistance.    MCALHANY,CHRISTOPHER  11/10/201511:23 AM

## 2014-08-12 NOTE — Consult Note (Addendum)
PULMONARY / CRITICAL CARE MEDICINE   Name: Karmen BongoHenry E Feild MRN: [id number] DOB: 01/18/1928    ADMISSION DATE:  08/05/2014 CONSULTATION DATE:  11/10  REFERRING MD :  Cards  CHIEF COMPLAINT:  Weak  INITIAL PRESENTATION: Bradycardia  STUDIES:    SIGNIFICANT EVENTS:    HISTORY OF PRESENT ILLNESS:   [name] is a 80 yo ,life long smoker, chewing tobacco user, O2 and steroid dependent COPD who was admitted with bradycardia from pacemaker failure. He is followed by Dr. Maple HudsonYoung of the pulmonary office. Note last seen in 2013 by Dr. Maple HudsonYoung. He has an extensive past medical history as noted below. He notes some shaking but no fever , sweats or purulent sputum. He denies chest pain. He is SOB but no worse than usual. PCCM asked to evaluate for hypercarbia with metabolic compensation.  PAST MEDICAL HISTORY :   has a past medical history of Osteoarthrosis, unspecified whether generalized or localized, unspecified site; Coronary atherosclerosis of unspecified type of vessel, native or graft; Chronic rhinitis; Chronic airway obstruction, not elsewhere classified; Atrial fibrillation; Gout; Chronic kidney disease; Hypertension; Myocardial infarction; Pneumonia; Anemia; H/O hiatal hernia; Depression; SSS (sick sinus syndrome); AAA (abdominal aortic aneurysm); Chronic anticoagulation; and Ischemic cardiomyopathy (Nov 2015).  has past surgical history that includes Pacemaker insertion (1991, 2010); Tonsillectomy; and Coccyx removal (24 yr ago). Prior to Admission medications   Medication Sig Start Date End Date Taking? Authorizing Provider  aspirin EC 81 MG tablet Take 1 tablet (81 mg total) by mouth daily. 10/20/11  Yes Jerilee FieldMatthew Eskridge, MD  atorvastatin (LIPITOR) 20 MG tablet Take 20 mg by mouth daily.     Yes Historical Provider, MD  cetirizine (ZYRTEC) 10 MG tablet Take 10 mg by mouth daily as needed for allergies.   Yes Historical Provider, MD  colchicine-probenecid 0.5-500 MG per tablet Take 1  tablet by mouth daily.  03/05/12  Yes Historical Provider, MD  digoxin (LANOXIN) 0.125 MG tablet Take 0.0625 mg by mouth daily.   Yes Historical Provider, MD  ferrous sulfate 325 (65 FE) MG EC tablet Take 325 mg by mouth daily with breakfast.   Yes Historical Provider, MD  finasteride (PROSCAR) 5 MG tablet Take 5 mg by mouth daily.   Yes Historical Provider, MD  fludrocortisone (FLORINEF) 0.1 MG tablet Take 0.1 mg by mouth daily.   Yes Historical Provider, MD  furosemide (LASIX) 20 MG tablet Take 20 mg by mouth daily.    Yes Historical Provider, MD  lubiprostone (AMITIZA) 24 MCG capsule Take 24 mcg by mouth daily with breakfast.   Yes Historical Provider, MD  metoprolol tartrate (LOPRESSOR) 25 MG tablet Take 12.5 mg by mouth daily.   Yes Historical Provider, MD  potassium chloride (K-DUR) 10 MEQ tablet Take 10 mEq by mouth daily. 07/10/13  Yes Historical Provider, MD  predniSONE (DELTASONE) 5 MG tablet Take 5 mg by mouth 2 (two) times daily with a meal. Pt currently only takes 5 mg   Yes Historical Provider, MD  probenecid (BENEMID) 500 MG tablet Take 500 mg by mouth daily.    Yes Historical Provider, MD  Tamsulosin HCl (FLOMAX) 0.4 MG CAPS Take 0.4 mg by mouth daily.    Yes Historical Provider, MD  warfarin (COUMADIN) 2 MG tablet Take 2 mg by mouth daily.   Yes Historical Provider, MD  albuterol (PROAIR HFA) 108 (90 BASE) MCG/ACT inhaler Inhale 2 puffs into the lungs 4 (four) times daily as needed. For shortness of breath    Historical Provider, MD  formoterol (FORADIL) 12 MCG capsule for inhaler Place 1 capsule (12 mcg total) into inhaler and inhale 2 (two) times daily. 02/08/12 02/07/13  Waymon Budgelinton D Young, MD  SYMBICORT 160-4.5 MCG/ACT inhaler Inhale 2 puffs into the lungs 2 (two) times daily.  07/10/13   Historical Provider, MD   Allergies  Allergen Reactions  . Sulfonamide Derivatives     REACTION: Swelling of jaws and had welps    FAMILY HISTORY:  indicated that his mother is deceased. He  indicated that his father is deceased. He indicated that his brother is deceased.  SOCIAL HISTORY:  reports that he has been smoking Cigarettes.  He has been smoking about 0.00 packs per day. His smokeless tobacco use includes Chew. He reports that he drinks alcohol. He reports that he does not use illicit drugs.  REVIEW OF SYSTEMS:   10 point review of system taken, please see HPI for positives and negatives.   SUBJECTIVE: denies SOB, is alert now  VITAL SIGNS: Temp:  [97.9 F (36.6 C)-98.3 F (36.8 C)] 97.9 F (36.6 C) (11/10 0939) Pulse Rate:  [29-117] 117 (11/10 1100) Resp:  [15-28] 19 (11/10 1100) BP: (72-112)/(47-88) 112/88 mmHg (11/10 1100) SpO2:  [89 %-100 %] 96 % (11/10 1225) Weight:  [162 lb 0.6 oz (73.5 kg)] 162 lb 0.6 oz (73.5 kg) (11/10 1117) HEMODYNAMICS:   VENTILATOR SETTINGS:   INTAKE / OUTPUT:  Intake/Output Summary (Last 24 hours) at 08/12/14 1313 Last data filed at 08/12/14 1000  Gross per 24 hour  Intake    360 ml  Output    100 ml  Net    260 ml    PHYSICAL EXAMINATION: General: Frail kyphoscolotic white male currently with chewing tobacco in his mouth.  Neuro:  Intact, follows commands HEENT:  Poor dentition. No JVD/LAN Cardiovascular:  HSIR , severe kyphoscoliosis  Lungs:  Mild rhonchi. Faint wheezes, prolonged exp phase Abdomen:  +bs Musculoskeletal:  Intact Skin:  Lower ext chronic stasis changes  LABS:  CBC  Recent Labs Lab 08/05/14 2326 08/08/14 0318  WBC 8.2 8.0  HGB 14.9 14.6  HCT 46.1 45.1  PLT 95* 82*   Coag's  Recent Labs Lab 08/10/14 0250 08/11/14 0320 08/12/14 0354  INR 2.62* 2.77* 2.97*   BMET  Recent Labs Lab 08/08/14 0318 08/10/14 0250 08/12/14 0354  NA 144 145 145  K 4.8 3.9 4.3  CL 103 100 100  CO2 30 39* 40*  BUN 36* 25* 33*  CREATININE 1.34 1.22 1.17  GLUCOSE 119* 156* 123*   Electrolytes  Recent Labs Lab 08/05/14 2326  08/08/14 0318 08/10/14 0250 08/12/14 0354  CALCIUM 8.7  < > 8.2* 8.1*  8.4  MG 2.4  --   --   --   --   < > = values in this interval not displayed. Sepsis Markers No results for input(s): LATICACIDVEN, PROCALCITON, O2SATVEN in the last 168 hours. ABG  Recent Labs Lab 08/12/14 1200  PHART 7.362  PCO2ART 73.9*  PO2ART 89.1   Liver Enzymes No results for input(s): AST, ALT, ALKPHOS, BILITOT, ALBUMIN in the last 168 hours. Cardiac Enzymes  Recent Labs Lab 08/05/14 2326 08/08/14 0318  TROPONINI <0.30  --   PROBNP 3935.0* 5097.0*   Glucose  Recent Labs Lab 08/05/14 2306  GLUCAP 103*    Imaging Dg Chest 2 View  08/11/2014   CLINICAL DATA:  Shortness of breath  EXAM: CHEST  2 VIEW  COMPARISON:  08/06/2014  FINDINGS: Cardiac shadow remains enlarged. A pacing device  is again seen and stable. Aortic calcifications are noted. Some interval clearing in the right lung base is noted. Persistent interstitial changes are seen. No focal confluent infiltrate is noted. A small pleural effusion is seen.  IMPRESSION: Interval improvement in right basilar changes. No new focal abnormality is seen.   Electronically Signed   By: Alcide Clever M.D.   On: 08/11/2014 11:09     ASSESSMENT / PLAN:  PULMONARY  A: Acute on chronic compensated hypercarbic resp failure in the setting of continued tobacco abuse with cigarettes and chewing tobacco. At time of consult was chewing tobacco. Severe Kyphoscoliosis.  pulm edema component from Fib P:   Stop tobacco use if possible He's already on steroids chronically. Consider increasing but he's not bronchospastic. But has a purse lip and prolonged exp phase O2 as needed to sats 88-92% NIMVS would be difficult for him and he is compensated. Not required unless neurostatus declines He may in fact be having some waxing declines in alertness from CO2 narcosis, just haven't seen on ABG Consider addressing code status as he is full code. White count and fever curve don't support infection Can we escalate diuresis? Was neg 130  cc last 48 hr No follow up abg required Continued symbicort  CARDIOVASCULAR CVL A:  CAF EF 45% PPM CHF CAD post LAD stent  P:  Per cards Can we increase lasix Rate control  RENAL A:   CRI P:   Lab Results  Component Value Date   CREATININE 1.17 08/12/2014   CREATININE 1.22 08/10/2014   CREATININE 1.34 08/08/2014    Follow creatine Consider further neg balance per cards   HEMATOLOGIC A:  Chronic anticoagulation for Afib P:  Lab Results  Component Value Date   INR 2.97* 08/12/2014   INR 2.77* 08/11/2014   INR 2.62* 08/10/2014  follow INR   ENDOCRINE A:   Chronic steroids  P:   Monitor glucose, especially as we increase steroids slight  NEUROLOGIC A:   Awake and alert Rass 1 P:   RASS goal: 1 Hold and sedating medications Avoid benzo as able  FAMILY  - Updates: to pt  - Inter-disciplinary family meet or Palliative Care meeting due by:  day 7    TODAY'S SUMMARY:  80 yo life long smoker, chewing tobacco user, with a plethora of health issues. ABG reveals Pco2 of 79 with PH 7.36 with chemistry CO2 of 40 who is currently chewing tobacco. He also has severe kyphoscoliosis.  PCCM asked to evaluate for hypercarbia.   Brett Canales Minor ACNP [facility] PCCM Pager [phone] till 3 pm If no answer page [phone] 08/12/2014, 1:48 PM   STAFF NOTE: I, Rory Percy, MD have personally reviewed patient's available data, including medical history, events of note, physical examination and test results as part of my evaluation. I have discussed with resident/NP and other care providers such as pharmacist, RN and RRT. In addition, I personally evaluated patient and elicited key findings of: SOB, hypercarbia compensated, continued prolonged expiratory phase, likely can afford more diuresis as well, increase steroids, lasix per cards, consider increase. NO NIMV required, consider code status discussions.  Mcarthur Rossetti. Tyson Alias, MD, FACP Pgr: [phone] [facility]

## 2014-08-12 NOTE — Clinical Documentation Improvement (Addendum)
Md's, NP's, and PA's  Noted admitting diagnosis "chronic atrial fibrillation", PDX   being documented as "atrial fibrillation w rvr", please clarify the "acute" condition that caused the patient to be admitted if known.  Thank you   Possible Conditions  Acute on Chronic compensated hypercarbic respiratory failure  Acute on Chronic Combined Systolic and Diastolic CHF  Exacerbation of Chronic Atrial Fibrillation   Pneumonia   Other condition   Not able to determine   Treatment: Lasix 40 mg, Continue steroids, O2 , amiodarone   Thank you,  Lavonda JumboLawanda J Shalandria Elsbernd ,RN Clinical Documentation Specialist:  [phone]  [facility]- Health Information Management

## 2014-08-13 LAB — CBC
HEMATOCRIT: 39.1 % (ref 39.0–52.0)
Hemoglobin: 12.9 g/dL — ABNORMAL LOW (ref 13.0–17.0)
MCH: 36.9 pg — AB (ref 26.0–34.0)
MCHC: 33 g/dL (ref 30.0–36.0)
MCV: 111.7 fL — AB (ref 78.0–100.0)
PLATELETS: 93 10*3/uL — AB (ref 150–400)
RBC: 3.5 MIL/uL — AB (ref 4.22–5.81)
RDW: 14.9 % (ref 11.5–15.5)
WBC: 6.5 10*3/uL (ref 4.0–10.5)

## 2014-08-13 LAB — BASIC METABOLIC PANEL
Anion gap: 6 (ref 5–15)
BUN: 33 mg/dL — ABNORMAL HIGH (ref 6–23)
CO2: 44 mEq/L (ref 19–32)
Calcium: 8.7 mg/dL (ref 8.4–10.5)
Chloride: 97 mEq/L (ref 96–112)
Creatinine, Ser: 1.13 mg/dL (ref 0.50–1.35)
GFR calc Af Amer: 67 mL/min — ABNORMAL LOW (ref >90)
GFR, EST NON AFRICAN AMERICAN: 58 mL/min — AB (ref >90)
GLUCOSE: 129 mg/dL — AB (ref 70–99)
POTASSIUM: 4 meq/L (ref 3.7–5.3)
Sodium: 147 mEq/L (ref 137–147)

## 2014-08-13 LAB — GLUCOSE, CAPILLARY
GLUCOSE-CAPILLARY: 104 mg/dL — AB (ref 70–99)
GLUCOSE-CAPILLARY: 240 mg/dL — AB (ref 70–99)
GLUCOSE-CAPILLARY: 187 mg/dL — AB (ref 70–99)
GLUCOSE-CAPILLARY: 158 mg/dL — AB (ref 70–99)

## 2014-08-13 LAB — PROTIME-INR
INR: 2.94 — ABNORMAL HIGH (ref 0.00–1.49)
Prothrombin Time: 30.9 seconds — ABNORMAL HIGH (ref 11.6–15.2)

## 2014-08-13 MED ORDER — LEVALBUTEROL HCL 0.63 MG/3ML IN NEBU
0.6300 mg | INHALATION_SOLUTION | Freq: Four times a day (QID) | RESPIRATORY_TRACT | Status: DC
  Administered 2014-08-13 – 2014-08-15 (×9): 0.63 mg via RESPIRATORY_TRACT
  Filled 2014-08-13 (×14): qty 3

## 2014-08-13 NOTE — Progress Notes (Signed)
Physical Therapy Treatment Patient Details Name: Willie BongoHenry E Blaydes MRN: [id number] DOB: 17-May-1928 Today's Date: 08/13/2014    History of Present Illness 80 y.o. male w/ PMHx significant for chronic afib and bradycardia requiring pacemaker, CAD s/p LAD stent 2000, severe COPD on home 02,  [facility] of [facility] and SOB and was subsequent [facility]2014 due to concerns of pacer failure.    PT Comments    Patient demonstrates progress with therapy this session.  Ambulated on 2 liters with SpO2 >95% during spot checks. Assisted with multiple transfers to bsc, chair and bed, performed bed mobility then assisted upright to eat his icey post activity. Will continue to see and progress as tolerated.   Follow Up Recommendations  SNF;Supervision/Assistance - 24 hour     Equipment Recommendations  Other (comment) (TBD)    Recommendations for Other Services       Precautions / Restrictions Precautions Precautions: Fall Restrictions Weight Bearing Restrictions: No    Mobility  Bed Mobility Overal bed mobility: Needs Assistance Bed Mobility: Supine to Sit;Sit to Supine     Supine to sit: Mod assist Sit to supine: Min assist   General bed mobility comments: assisted back to bed, then assist upright to EOB so that patient could eat lunch   Transfers Overall transfer level: Needs assistance Equipment used: Rolling walker (2 wheeled) Transfers: Sit to/from Stand Sit to Stand: Min assist Stand pivot transfers: Min assist       General transfer comment: VCs for hand placement and safety with mobility.    Ambulation/Gait Ambulation/Gait assistance: Min assist Ambulation Distance (Feet): 70 Feet Assistive device: Rolling walker (2 wheeled) Gait Pattern/deviations: Step-to pattern;Decreased stride length;Shuffle;Trunk flexed;Narrow base of support Gait velocity: decreased Gait velocity interpretation: Below normal speed  for age/gender General Gait Details: patient agreeable to ambulation this session with reward of lemon icey   Stairs            Wheelchair Mobility    Modified Rankin (Stroke Patients Only)       Balance     Sitting balance-Leahy Scale: Fair       Standing balance-Leahy Scale: Poor                      Cognition Arousal/Alertness: Awake/alert Behavior During Therapy: Flat affect Overall Cognitive Status: No family/caregiver present to determine baseline cognitive functioning Area of Impairment: Orientation;Attention;Awareness;Problem solving;Memory Orientation Level: Disoriented to;Time;Situation;Place Current Attention Level: Sustained Memory: Decreased short-term memory     Awareness: Intellectual Problem Solving: Slow processing;Decreased initiation;Difficulty sequencing;Requires verbal cues;Requires tactile cues General Comments: improved overal arousal this session    Exercises      General Comments General comments (skin integrity, edema, etc.): multiple assessment of VS, SpO2 >95% throughout activity      Pertinent Vitals/Pain Pain Assessment: No/denies pain    Home Living                      Prior Function            PT Goals (current goals can now be found in the care plan section) Acute Rehab PT Goals Patient Stated Goal: to get stronger PT Goal Formulation: With patient Time For Goal Achievement: 11-25-2013 Potential to Achieve Goals: Fair Progress towards PT goals: Progressing toward goals    Frequency  Min 2X/week    PT Plan Current plan remains appropriate    Co-evaluation  End of Session Equipment Utilized During Treatment: Gait belt;Oxygen Activity Tolerance: Patient tolerated treatment well Patient left: in chair;with call bell/phone within reach     Time: 1330-1354 PT Time Calculation (min) (ACUTE ONLY): 24 min  Charges:  $Gait Training: 8-22 mins $Therapeutic Activity: 8-22  mins                    G CodesFabio Asa:      Vasilisa Vore J 08/13/2014, 1:59 PM Charlotte Crumbevon Stryker Veasey, PT DPT  [phone]

## 2014-08-13 NOTE — Progress Notes (Signed)
PULMONARY / CRITICAL CARE MEDICINE   Name: Willie [name] MRN: [id number] DOB: [birth year]    ADMISSION DATE:  08/05/2014 CONSULTATION DATE:  11/10  REFERRING MD :  Cards  CHIEF COMPLAINT:  Weak  INITIAL PRESENTATION: Willie [name] is a 80 yo ,life long smoker, chewing tobacco user, O2 and steroid dependent COPD who was admitted with bradycardia from pacemaker failure. He is followed by Dr. Maple HudsonYoung of the pulmonary office. Note last seen in 2013 by Dr. Maple HudsonYoung. He has an extensive past medical history as noted below. He notes some shaking but no fever , sweats or purulent sputum. He denies chest pain. He is SOB but no worse than usual. PCCM asked to evaluate for hypercarbia with metabolic compensation.  STUDIES:    SIGNIFICANT EVENTS:   SUBJECTIVE: Alert, denies SOB. States some improvement from yesterday after rroids increased and more neg balance   VITAL SIGNS: Temp:  [98.1 F (36.7 C)-98.6 F (37 C)] 98.3 F (36.8 C) (11/11 0732) Pulse Rate:  [47-117] 93 (11/11 0500) Resp:  [13-30] 21 (11/11 0500) BP: (80-112)/(40-88) 95/58 mmHg (11/11 0500) SpO2:  [94 %-99 %] 99 % (11/11 0740) Weight:  [73.5 kg (162 lb 0.6 oz)-74.9 kg (165 lb 2 oz)] 74.9 kg (165 lb 2 oz) (11/11 0500) HEMODYNAMICS:   VENTILATOR SETTINGS:   INTAKE / OUTPUT:  Intake/Output Summary (Last 24 hours) at 08/13/14 1008 Last data filed at 08/13/14 [id number]  Gross per 24 hour  Intake    240 ml  Output   2750 ml  Net  -2510 ml    PHYSICAL EXAMINATION: General: Frail kyphoscolotic white male, no purse lip breathing today Neuro:  Alert, oriented, non-focal  HEENT:  Poor dentition. No JVD/LAN Cardiovascular:  Tachy, irreg irreg Lungs:  Mild rhonchi. Faint wheezes Abdomen:  +bs Musculoskeletal:  Intact Skin:  Lower ext chronic stasis changes  LABS:  CBC  Recent Labs Lab 08/08/14 0318 08/13/14 0100  WBC 8.0 6.5  HGB 14.6 12.9*  HCT 45.1 39.1  PLT 82* 93*   Coag's  Recent Labs Lab 08/11/14 0320  08/12/14 0354 08/13/14 0321  INR 2.77* 2.97* 2.94*   BMET  Recent Labs Lab 08/10/14 0250 08/12/14 0354 08/13/14 0321  NA 145 145 147  K 3.9 4.3 4.0  CL 100 100 97  CO2 39* 40* 44*  BUN 25* 33* 33*  CREATININE 1.22 1.17 1.13  GLUCOSE 156* 123* 129*   Electrolytes  Recent Labs Lab 08/10/14 0250 08/12/14 0354 08/13/14 0321  CALCIUM 8.1* 8.4 8.7   Sepsis Markers No results for input(s): LATICACIDVEN, PROCALCITON, O2SATVEN in the last 168 hours. ABG  Recent Labs Lab 08/12/14 1200  PHART 7.362  PCO2ART 73.9*  PO2ART 89.1   Liver Enzymes No results for input(s): AST, ALT, ALKPHOS, BILITOT, ALBUMIN in the last 168 hours. Cardiac Enzymes  Recent Labs Lab 08/08/14 0318  PROBNP 5097.0*   Glucose  Recent Labs Lab 08/12/14 2202 08/13/14 0735  GLUCAP 305* 104*    Imaging No results found.   ASSESSMENT / PLAN:  PULMONARY  A: Acute on chronic compensated hypercarbic resp failure in the setting of continued tobacco abuse with cigarettes and chewing tobacco. At time of consult was chewing tobacco. Severe Kyphoscoliosis.  pulm edema component from Fib P:  O2 as needed to sats 88-92% Continue lasix (increased to 40 BID 11/10), has been beneficial scheduled BD Continued Symbicort Consider slight reduction in prednisone 11/12, seems improved after increase Ambulatory desaturation assessment pre dc  CARDIOVASCULAR CVL A:  CAF  EF 45% PPM CHF CAD post LAD stent  P:  Per cards Lasix increased 11/10 Rate control  RENAL A:   CRI P:   Lab Results  Component Value Date   CREATININE 1.13 08/13/2014   CREATININE 1.17 08/12/2014   CREATININE 1.22 08/10/2014    Follow creatine Consider further neg balance per cards   HEMATOLOGIC A:  Chronic anticoagulation for Afib P:  Lab Results  Component Value Date   INR 2.94* 08/13/2014   INR 2.97* 08/12/2014   INR 2.77* 08/11/2014  follow INR   ENDOCRINE A:   Chronic steroids  P:   Monitor  glucose, especially as we increase steroids slight  NEUROLOGIC A:   Awake and alert Rass 1 P:   RASS goal: 1 Avoid sedating meds, particularly benzos  FAMILY  - Updates: to pt  - Inter-disciplinary family meet or Palliative Care meeting due by:  day 7    TODAY'S SUMMARY:  Appears as though increased diuresis and steroids have been beneficial for him. Look much better, no longer pursed lip breathing. I think DC to SNF tomorrow is reasonable, assuming he is able to complete an ambulatory desaturation assessment without a profound drop in SpO2. Recommend continuing lasix and prednisone at increased doses for another 24 hours. Will re-eval tomorrow.   Willie [name], ACNP [facility] Pager [phone] or [phone]  STAFF NOTE: I, @SIGNATURE @ have personally reviewed patient's available data, including medical history, events of note, physical examination and test results as part of my evaluation. I have discussed with resident/NP and other care providers such as pharmacist, RN and RRT. In addition, I personally evaluated patient and elicited key findings of: less osb, less purse lip breathing and accessory after lasix increased and pred increase, need ambulatory pulse ox  Willie Rossettianiel J. Tyson AliasFeinstein, MD, FACP Pgr: [phone] [facility]

## 2014-08-13 NOTE — Progress Notes (Signed)
     SUBJECTIVE: Breathing is better. No pain.   BP 95/58 mmHg  Pulse 93  Temp(Src) 98.3 F (36.8 C) (Oral)  Resp 21  Ht 5\' 8"  (1.727 m)  Wt 165 lb 2 oz (74.9 kg)  BMI 25.11 kg/m2  SpO2 99%  Intake/Output Summary (Last 24 hours) at 08/13/14 0912 Last data filed at 08/13/14 [id number]  Gross per 24 hour  Intake    480 ml  Output   2750 ml  Net  -2270 ml    PHYSICAL EXAM General: Well developed, well nourished, in no acute distress. Alert and oriented x 3.  Psych:  Good affect, responds appropriately Neck: No JVD. No masses noted.  Lungs: Clear bilaterally with no wheezes or rhonci noted.  Heart: irreg irreg with no murmurs noted. Abdomen: Bowel sounds are present. Soft, non-tender.  Extremities: No lower extremity edema.   LABS: Basic Metabolic Panel:  Recent Labs  60/63/110/07/17 0354 08/13/14 0321  NA 145 147  K 4.3 4.0  CL 100 97  CO2 40* 44*  GLUCOSE 123* 129*  BUN 33* 33*  CREATININE 1.17 1.13  CALCIUM 8.4 8.7   CBC:  Recent Labs  08/13/14 0100  WBC 6.5  HGB 12.9*  HCT 39.1  MCV 111.7*  PLT 93*   Current Meds: . amiodarone  200 mg Oral Daily  . antiseptic oral rinse  7 mL Mouth Rinse BID  . aspirin EC  81 mg Oral Daily  . atorvastatin  20 mg Oral Daily  . budesonide-formoterol  2 puff Inhalation BID  . chlorhexidine  15 mL Mouth Rinse BID  . digoxin  0.125 mg Oral Daily  . furosemide  40 mg Oral BID  . midodrine  5 mg Oral TID WC  . nebivolol  5 mg Oral Daily  . predniSONE  40 mg Oral BID WC  . sodium chloride  3 mL Intravenous Q12H  . tamsulosin  0.4 mg Oral QPC supper  . Warfarin - Pharmacist Dosing Inpatient   Does not apply q1800    ASSESSMENT AND PLAN:  1. CAD:  S/P LAD stent 2000. No angina. Continue ASA, beta blocker, statin  2. Permanent atrial fibrillation: Rate controlled with amiodarone, beta blocker and digoxin. BP stable  3. Chronic anticoagulation with Coumadin:  INR therapeutic at 2.94 this am.   4. Pacemaker In place: [employer]  Accent DR RF 2010 (initial pacemaker implant 1991), working well. Question of lead issues leading to admission.  5. Orthostatic hypotension: BP low normal. Midodrine added 08/09/14  6. Chronic renal insufficiency, stage III: renal function is stable.   7. Cardiomyopathy, ischemic: LVEF 45-50%   8. Acute on chronic combined systolic and diastolic congestive heart failure: volume status stable but question if some degree of CHF may be contributing to his respiratory insufficiency. Lasix increased to 40 mg po BID yesterday with good diuresis. (-2.2 liters last 24 hours).  9. COPD/Somnolence/Acute on chronic compensated hypercarbic resp failure in the setting of continued tobacco abuse with cigarettes and chewing tobacco.: Appreciate pulmonary consult. Lasix increased. Continue steroids. No further ABG per Pulmonary. No recommendation to start Bipap. He has known severe COPD and is on home O2 but non-compliant per records.   Dispo:  SNF bed is available. I would favor f/u with pulmonary opinion today. Follow diuresis and BP x 24 more hours. Can decide on lasix dose tomorrow. If stable tomorrow, would plan d/c to SNF tomorrow.   Willie [name],Willie [name]  11/11/20159:12 AM

## 2014-08-13 NOTE — Progress Notes (Signed)
ANTICOAGULATION CONSULT NOTE  Pharmacy Consult for coumadin Indication: atrial fibrillation  Allergies  Allergen Reactions  . Sulfonamide Derivatives     REACTION: Swelling of jaws and had welps    Patient Measurements: Height: 5\' 8"  (172.7 cm) Weight: 165 lb 2 oz (74.9 kg) IBW/kg (Calculated) : 68.4   Vital Signs: Temp: 98.3 F (36.8 C) (11/11 0732) Temp Source: Oral (11/11 0732) BP: 95/58 mmHg (11/11 0500) Pulse Rate: 93 (11/11 0500)  Labs:  Recent Labs  08/11/14 0320 08/12/14 0354 08/13/14 0100 08/13/14 0321  HGB  --   --  12.9*  --   HCT  --   --  39.1  --   PLT  --   --  93*  --   LABPROT 29.4* 31.1*  --  30.9*  INR 2.77* 2.97*  --  2.94*  CREATININE  --  1.17  --  1.13    Estimated Creatinine Clearance: 47.1 mL/min (by C-G formula based on Cr of 1.13).  Assessment: 80 yo admitted 08/05/2014 with weakness.  Pharmacy consulted to dose warfarin for AFib.  INR 2.94 today - dose held 11/10. Pt also with new amio this admit which can potentiate effects of warfarin. CBC wnl, no bleeding noted PTA dose 2mg  daily  Goal of Therapy:  INR 2-3 Monitor platelets by anticoagulation protocol: Yes   Plan:  No coumadin today -likely will need 1 mg when d/c home. Will plan to start 1mg  daily tomorrow as INR appears to plateaued. Daily PT/INR  Elder CyphersLorie Sema Stangler, BS Pharm D, BCPS Clinical Pharmacist 08/13/2014 11:13 AM

## 2014-08-13 NOTE — Progress Notes (Signed)
CRITICAL VALUE ALERT  Critical value received:  CO2 44  Date of notification:  08/13/14  Time of notification:  0439  Critical value read back: yes  Nurse who received alert:  Nickolas MadridLetitia Martine Trageser, RN  MD notified (1st page):  Dr. Levy Pupaobert Byrum  Time of first page:   MD notified (2nd page):  Time of second page:  Responding MD:  Dr. Levy Pupaobert Byrum  Time MD responded:  [phone]

## 2014-08-14 LAB — BASIC METABOLIC PANEL
ANION GAP: 6 (ref 5–15)
BUN: 39 mg/dL — ABNORMAL HIGH (ref 6–23)
CO2: 44 meq/L — AB (ref 19–32)
CREATININE: 1.27 mg/dL (ref 0.50–1.35)
Calcium: 8.9 mg/dL (ref 8.4–10.5)
Chloride: 96 mEq/L (ref 96–112)
GFR calc Af Amer: 58 mL/min — ABNORMAL LOW (ref >90)
GFR calc non Af Amer: 50 mL/min — ABNORMAL LOW (ref >90)
Glucose, Bld: 153 mg/dL — ABNORMAL HIGH (ref 70–99)
Potassium: 4.1 mEq/L (ref 3.7–5.3)
Sodium: 146 mEq/L (ref 137–147)

## 2014-08-14 LAB — GLUCOSE, CAPILLARY
GLUCOSE-CAPILLARY: 171 mg/dL — AB (ref 70–99)
Glucose-Capillary: 191 mg/dL — ABNORMAL HIGH (ref 70–99)
Glucose-Capillary: 100 mg/dL — ABNORMAL HIGH (ref 70–99)
Glucose-Capillary: 191 mg/dL — ABNORMAL HIGH (ref 70–99)

## 2014-08-14 LAB — PROTIME-INR
INR: 2.65 — AB (ref 0.00–1.49)
Prothrombin Time: 28.5 seconds — ABNORMAL HIGH (ref 11.6–15.2)

## 2014-08-14 MED ORDER — WARFARIN SODIUM 1 MG PO TABS
1.0000 mg | ORAL_TABLET | Freq: Once | ORAL | Status: AC
  Administered 2014-08-14: 1 mg via ORAL
  Filled 2014-08-14: qty 1

## 2014-08-14 MED ORDER — ALBUTEROL SULFATE HFA 108 (90 BASE) MCG/ACT IN AERS: 1.0000 | INHALATION_SPRAY | Freq: Three times a day (TID) | RESPIRATORY_TRACT | Status: AC | PRN

## 2014-08-14 MED ORDER — ACETAMINOPHEN 325 MG PO TABS: 650.0000 mg | ORAL_TABLET | ORAL | Status: AC | PRN

## 2014-08-14 MED ORDER — NEBIVOLOL HCL 5 MG PO TABS: 5.0000 mg | ORAL_TABLET | Freq: Every day | ORAL | Status: DC

## 2014-08-14 MED ORDER — FERROUS SULFATE 325 (65 FE) MG PO TBEC: 325.0000 mg | DELAYED_RELEASE_TABLET | Freq: Every day | ORAL | Status: AC

## 2014-08-14 MED ORDER — SALINE SPRAY 0.65 % NA SOLN
1.0000 | NASAL | Status: DC | PRN
  Filled 2014-08-14: qty 44

## 2014-08-14 MED ORDER — PREDNISONE 10 MG PO TABS: 5.0000 mg | ORAL_TABLET | Freq: Every day | ORAL | Status: DC

## 2014-08-14 MED ORDER — FUROSEMIDE 40 MG PO TABS: 40.0000 mg | ORAL_TABLET | Freq: Every day | ORAL | Status: AC

## 2014-08-14 MED ORDER — LEVALBUTEROL HCL 0.63 MG/3ML IN NEBU: 0.6300 mg | INHALATION_SOLUTION | Freq: Four times a day (QID) | RESPIRATORY_TRACT | Status: AC

## 2014-08-14 MED ORDER — MIDODRINE HCL 5 MG PO TABS: 5.0000 mg | ORAL_TABLET | Freq: Three times a day (TID) | ORAL | Status: AC

## 2014-08-14 MED ORDER — DIGOXIN 125 MCG PO TABS: 0.1250 mg | ORAL_TABLET | Freq: Every day | ORAL | Status: AC

## 2014-08-14 MED ORDER — WARFARIN SODIUM 1 MG PO TABS: 1.0000 mg | ORAL_TABLET | Freq: Every day | ORAL | Status: AC

## 2014-08-14 MED ORDER — PREDNISONE 20 MG PO TABS
20.0000 mg | ORAL_TABLET | Freq: Two times a day (BID) | ORAL | Status: DC
Start: 2014-08-14 — End: 2014-08-15
  Administered 2014-08-14 – 2014-08-15 (×3): 20 mg via ORAL
  Filled 2014-08-14 (×4): qty 1

## 2014-08-14 MED ORDER — AMIODARONE HCL 200 MG PO TABS: 200.0000 mg | ORAL_TABLET | Freq: Every day | ORAL | Status: AC

## 2014-08-14 NOTE — Plan of Care (Signed)
Problem: Phase II Progression Outcomes Goal: Discharge plan established Outcome: Progressing     

## 2014-08-14 NOTE — Progress Notes (Signed)
Physical Therapy Treatment Patient Details Name: Willie [name] E Thau MRN: [id number] DOB: 01-13-1928 Today's Date: 08/14/2014    History of Present Illness 80 y.o. male w/ PMHx significant for chronic afib and bradycardia requiring pacemaker, CAD s/p LAD stent 2000, severe COPD on home 02,  [facility] of [facility] and [facility] and was subsequent [facility] due to concerns of pacer failure.    PT Comments    Patient received saturated in urine, hygiene and pericare performed. Patient then ambulated in hall this session on supplemental O2, initially on 3 liters for extended distance and remained >90% (16310ft), then patient dropped down to 2 liters with desaturation to 86% on 2 liters for remainder of distance ( additional 50 ft with increased WOB and some dsypnea. Patient did endorse that that was the furthest that he has walked in "a long time."      Follow Up Recommendations  SNF;Supervision/Assistance - 24 hour     Equipment Recommendations  Other (comment) (TBD)    Recommendations for Other Services       Precautions / Restrictions Precautions Precautions: Fall Restrictions Weight Bearing Restrictions: No    Mobility  Bed Mobility Overal bed mobility: Needs Assistance Bed Mobility: Supine to Sit     Supine to sit: Min assist     General bed mobility comments: patient assisted to EOB   Transfers Overall transfer level: Needs assistance Equipment used: Rolling walker (2 wheeled)   Sit to Stand: Min assist Stand pivot transfers: Min assist       General transfer comment: performed x6 during hygiene and from bed, VCs for hand placement and stability, poor compliance   Ambulation/Gait Ambulation/Gait assistance: Min assist Ambulation Distance (Feet): 160 Feet (50' on 2 liters, 110 ft on 3 liters) Assistive device: Rolling walker (2 wheeled) Gait Pattern/deviations: Step-to pattern;Decreased stride  length;Shuffle;Trunk flexed;Narrow base of support Gait velocity: decreased Gait velocity interpretation: Below normal speed for age/gender General Gait Details: patient required MAX cues and assist for safety with mobility, difficulty for safety with use of RW.    Stairs            Wheelchair Mobility    Modified Rankin (Stroke Patients Only)       Balance   Sitting-balance support: Feet supported Sitting balance-Leahy Scale: Fair  Tolerated sitting on BSC for >20 minutes intermittently during hygiene and pericare tasks.   Standing balance support: Single extremity supported;During functional activity Standing balance-Leahy Scale: Poor (dynamic balance performed during functional self care tasks)                      Cognition Arousal/Alertness: Awake/alert Behavior During Therapy: Flat affect Overall Cognitive Status: No family/caregiver present to determine baseline cognitive functioning Area of Impairment: Orientation;Attention;Awareness;Problem solving;Memory   Current Attention Level: Sustained Memory: Decreased short-term memory     Awareness: Intellectual Problem Solving: Slow processing;Decreased initiation;Difficulty sequencing;Requires verbal cues;Requires tactile cues General Comments: Difficulty sequencing through tasks of bathing, privded multiple cues and tactile cues.  Patient with poor awareness, laying in saturated bed with urine soaked gown and linens and no recognition to call for assist.    Exercises      General Comments General comments (skin integrity, edema, etc.): extended distance with ambulation, multiple assessment of SpO2, oxygen saturations remained >90% on 3 liters, when decreased to 2 liters patient desaturated to 86% with increased work of breathing and dyspnea.  Patient assisted with bathing and hygiene  during session, dynamic balance performed with assist for these activities. Patient with some delayed processing of hygiene  tasks.     Pertinent Vitals/Pain Faces Pain Scale: No hurt    Home Living                      Prior Function            PT Goals (current goals can now be found in the care plan section) Acute Rehab PT Goals Patient Stated Goal: to get stronger PT Goal Formulation: With patient Time For Goal Achievement: 2013-11-15 Potential to Achieve Goals: Fair Progress towards PT goals: Progressing toward goals    Frequency  Min 2X/week    PT Plan Current plan remains appropriate    Co-evaluation             End of Session Equipment Utilized During Treatment: Gait belt;Oxygen Activity Tolerance: Patient tolerated treatment well Patient left: in chair;with call bell/phone within reach Nurse Communication: patient in urine upon entering the room, also with + BM, Bathed, VS provided, O2 saturations provided, floor needs to be cleaned ZO:XWRUEAVre:hygiene.    Time: [id number] PT Time Calculation (min) (ACUTE ONLY): 48 min  Charges:  $Gait Training: 8-22 mins $Therapeutic Activity: 8-22 mins $Self Care/Home Management: 8-22                    G CodesFabio Asa:      Callahan Peddie J 08/14/2014, 1:48 PM Charlotte Crumbevon Tayten Heber, PT DPT  [phone]

## 2014-08-14 NOTE — Progress Notes (Addendum)
PULMONARY / CRITICAL CARE MEDICINE   Name: Willie [name] MRN: [id number] DOB: 1928/03/23    ADMISSION DATE:  08/05/2014 CONSULTATION DATE:  11/10  REFERRING MD :  Cards  CHIEF COMPLAINT:  Weak  INITIAL PRESENTATION: Mr. Willie [name] is a 80 yo ,life long smoker, chewing tobacco user, O2 and steroid dependent COPD who was admitted with bradycardia from pacemaker failure. He is followed by Dr. Maple HudsonYoung of the pulmonary office. Note last seen in 2013 by Dr. Maple HudsonYoung. He has an extensive past medical history as noted below. He notes some shaking but no fever , sweats or purulent sputum. He denies chest pain. He is SOB but no worse than usual. PCCM asked to evaluate for hypercarbia with metabolic compensation.  STUDIES:    SIGNIFICANT EVENTS:   SUBJECTIVE: didn't walk, no changes in breathing   VITAL SIGNS: Temp:  [98 F (36.7 C)-98.3 F (36.8 C)] 98.3 F (36.8 C) (11/12 0728) Pulse Rate:  [67-115] 109 (11/12 0800) Resp:  [11-27] 22 (11/12 0800) BP: (81-127)/(40-90) 96/62 mmHg (11/12 0800) SpO2:  [94 %-100 %] 98 % (11/12 0800) Weight:  [72.4 kg (159 lb 9.8 oz)] 72.4 kg (159 lb 9.8 oz) (11/12 0455) HEMODYNAMICS:   VENTILATOR SETTINGS:   INTAKE / OUTPUT:  Intake/Output Summary (Last 24 hours) at 08/14/14 1004 Last data filed at 08/13/14 1925  Gross per 24 hour  Intake    440 ml  Output    400 ml  Net     40 ml    PHYSICAL EXAMINATION: General: Frail kyphoscolotic white male, no purse lip breathing, appears to be at baseline Neuro:  Alert, oriented, non-focal  HEENT:  Poor dentition. No JVD/LAN Cardiovascular:  Not tachy today irreg irreg Lungs: moves air better than days prior, poor entry at baseline Abdomen:  +bs, no r/g Musculoskeletal:  Intact Skin:  Lower ext chronic stasis changes  LABS:  CBC  Recent Labs Lab 08/08/14 0318 08/13/14 0100  WBC 8.0 6.5  HGB 14.6 12.9*  HCT 45.1 39.1  PLT 82* 93*   Coag's  Recent Labs Lab 08/12/14 0354 08/13/14 0321  08/14/14 0342  INR 2.97* 2.94* 2.65*   BMET  Recent Labs Lab 08/12/14 0354 08/13/14 0321 08/14/14 0342  NA 145 147 146  K 4.3 4.0 4.1  CL 100 97 96  CO2 40* 44* 44*  BUN 33* 33* 39*  CREATININE 1.17 1.13 1.27  GLUCOSE 123* 129* 153*   Electrolytes  Recent Labs Lab 08/12/14 0354 08/13/14 0321 08/14/14 0342  CALCIUM 8.4 8.7 8.9   Sepsis Markers No results for input(s): LATICACIDVEN, PROCALCITON, O2SATVEN in the last 168 hours. ABG  Recent Labs Lab 08/12/14 1200  PHART 7.362  PCO2ART 73.9*  PO2ART 89.1   Liver Enzymes No results for input(s): AST, ALT, ALKPHOS, BILITOT, ALBUMIN in the last 168 hours. Cardiac Enzymes  Recent Labs Lab 08/08/14 0318  PROBNP 5097.0*   Glucose  Recent Labs Lab 08/12/14 2202 08/13/14 0735 08/13/14 1148 08/13/14 1601 08/13/14 2143 08/14/14 0725  GLUCAP 305* 104* 240* 187* 158* 191*    Imaging No results found.   ASSESSMENT / PLAN:  PULMONARY  A: Acute on chronic compensated hypercarbic resp failure in the setting of continued tobacco abuse with cigarettes and chewing tobacco. At time of consult was chewing tobacco. Severe Kyphoscoliosis.  pulm edema component from Fib P:  O2 as needed to sats 88-92% Have asked RN to walk and check pulse ox, ensure can handle O2 needs at home Would reduce pred to  off in 7 days taper, its not clear that he is pred dependent upon reviewing the med list from home BDer regimen to remain Have maxed lasix appears, consider reduction Seems he is back to baseline  CARDIOVASCULAR CVL A:  CAF EF 45% PPM CHF CAD post LAD stent  P:  Per cards Lasix increased 11/10, consider reduce see renal fxn Rate control per cards, successsful  RENAL A:   CRI P:   Lab Results  Component Value Date   CREATININE 1.27 08/14/2014   CREATININE 1.13 08/13/2014   CREATININE 1.17 08/12/2014    Follow creatine Reduce lasix   TODAY'S SUMMARY: LIkley at baseline now, have mexed lasix,  consider redcution, taper roids over 1 week to home dose 5 mg, await pulse ox, can dc if not desat on home O2 needs with ambulation   Mcarthur Rossettianiel J. Tyson AliasFeinstein, MD, FACP Pgr: [phone] [facility]

## 2014-08-14 NOTE — Progress Notes (Addendum)
SUBJECTIVE: No complaints this am. Breathing is better. NO chest pain.   BP 97/70 mmHg  Pulse 85  Temp(Src) 98.1 F (36.7 C) (Oral)  Resp 18  Ht 5\' 8"  (1.727 m)  Wt 159 lb 9.8 oz (72.4 kg)  BMI 24.27 kg/m2  SpO2 98%  Intake/Output Summary (Last 24 hours) at 08/14/14 0720 Last data filed at 08/13/14 1925  Gross per 24 hour  Intake    680 ml  Output   1000 ml  Net   -320 ml    PHYSICAL EXAM General: Elderly, cachectic. Alert and oriented but drifts off to sleep during conversation.  Psych: Good affect, responds appropriately Neck: No JVD. No masses noted.  Lungs: Scattered wheezes.   Heart: irreg irreg with no murmurs noted. Abdomen: Bowel sounds are present. Soft, non-tender.  Extremities: No lower extremity edema.   LABS: Basic Metabolic Panel:  Recent Labs  16/07/9610/11/15 0321 08/14/14 0342  NA 147 146  K 4.0 4.1  CL 97 96  CO2 44* 44*  GLUCOSE 129* 153*  BUN 33* 39*  CREATININE 1.13 1.27  CALCIUM 8.7 8.9   CBC:  Recent Labs  08/13/14 0100  WBC 6.5  HGB 12.9*  HCT 39.1  MCV 111.7*  PLT 93*   Current Meds: . amiodarone  200 mg Oral Daily  . antiseptic oral rinse  7 mL Mouth Rinse BID  . aspirin EC  81 mg Oral Daily  . atorvastatin  20 mg Oral Daily  . budesonide-formoterol  2 puff Inhalation BID  . chlorhexidine  15 mL Mouth Rinse BID  . digoxin  0.125 mg Oral Daily  . furosemide  40 mg Oral BID  . levalbuterol  0.63 mg Nebulization Q6H  . midodrine  5 mg Oral TID WC  . nebivolol  5 mg Oral Daily  . predniSONE  40 mg Oral BID WC  . sodium chloride  3 mL Intravenous Q12H  . tamsulosin  0.4 mg Oral QPC supper  . Warfarin - Pharmacist Dosing Inpatient   Does not apply q1800   ASSESSMENT AND PLAN:  1. CAD: Stable. No angina. Will continue ASA, beta blocker and statin  2. Permanent atrial fibrillation: Now rate controlled with amiodarone, beta blocker and digoxin. Anti-coagulation with coumadin. INR is therapeutic.   3. Pacemaker In  place: [employer] Accent DR RF 2010 (initial pacemaker implant 1991), working well. Question of lead issues leading to admission.  4. COPD/Somnolence/Acute on chronic compensated hypercarbic resp failure in the setting of continued tobacco abuse with cigarettes and chewing tobacco: He has known severe COPD and is on home O2 but non-compliant per records. Appreciate pulmonary consult. Seems to be improved with increase in steroids and increased diuresis. Continue steroids.  Final recs on steroid dosage per Pulmonary today. Volume status is stable. Will plan discharge dose of Lasix at 40 mg per day.   5. Orthostatic hypotension: BP stable/low. Likely will not be able to improve this. Continue midodrine  6. Chronic renal insufficiency, stage III: renal function is stable.   7. Cardiomyopathy, ischemic: LVEF 45-50%   8. Acute on chronic combined systolic and diastolic congestive heart failure: volume status stable. Discharge dose of Lasix will be 40 mg per day.   Dispo: Possible d/c to SNF today if ok with pulmonary team.     Addendum: Spoke with family this am. See pulmonary note. D/c to SNF today. Steroid taper. Follow up Dr. Rubie Maidcroituro in 2-3 weeks.   MCALHANY,CHRISTOPHER  11/12/20157:20 AM

## 2014-08-14 NOTE — Clinical Social Work Note (Addendum)
4:30 Patient will stay another night and be re-evaluated tomorrow for discharge (11/13).  Facility and patients sons informed of delayed DC.  3:00 CSW informed Victorino DikeJennifer [facility] [place] that patient would be able to DC [phone] and faxed DC summary. Patients son to transport patient after work.  CSW then received call from RN that patients O2 levels dropped during PT.  CSW spoke with patients son, Remigio Eisenmengereddy [phone]), to inform of patients delay in discharge plan.  Patients son stated that patient often has O2 levels drop when active and reported that this is his baseline- was not concerned about 86% during PT.  CSW informed RN and Cardiology PA- Pulmonologist to speak with patients son concerning O2 levels and discuss best DC plan.  CSW will continue to follow.  Merlyn LotJenna Holoman, LCSWA Clinical Social Worker [phone]

## 2014-08-14 NOTE — Discharge Summary (Signed)
CARDIOLOGY DISCHARGE SUMMARY   Patient ID: Willie [name] MRN: [id number] DOB/AGE: [birth year] 80 y.o.  Admit date: 08/05/2014 Discharge date: 08/14/2014  PCP: Coralie KeensNewsome, Samuel, MD Primary Cardiologist: Dr. Royann Shiversroitoru  Primary Discharge Diagnosis:   Atrial fibrillation with RVR Secondary Discharge Diagnosis:    CAD S/P LAD stent 2000   Permanent atrial fibrillation   COPD mixed type   Chronic anticoagulation with Coumadin   Pacemaker - St. Jude Accent DR RF 2010 (initial pacemaker implant 1991), programmed VVIR   Orthostatic hypotension   AAA (abdominal aortic aneurysm)   CAP (community acquired pneumonia)   Chronic renal insufficiency, stage III (moderate)   Cardiomyopathy, ischemic- EF 45-50% Nov 2015   Acute on chronic combined systolic and diastolic congestive heart failure   Hyperkalemia   Pneumonia   Thrombocytopenia    Deconditioning, Weakness  Consults: Pulmonary  Procedures: 2-D echocardiogram, St. Jude device interrogation  Hospital Course: Willie [name] is a 80 y.o. male with a history of CAD, bradycardia requiring a St. Jude pacemaker and severe COPD on home O2. [facility] with complaints of weakness and shortness of breath. He was transferred to [facility] on 11/03 due to concerns of pacer failure. There were reports of monitored bradycardia into the 30s but no strips were available for review.  On admission, he was in atrial fibrillation with rapid ventricular response heart rate in the 130s. He had been on digoxin prior to admission and this was continued. He was started on nebivolol 5 mg daily for rate control. His heart rate was still not well-controlled so he was started on amiodarone. No increase in his beta blocker or additional rate control medications such as Cardizem were possible due to hypotension. He is to load amiodarone slowly at 200 mg daily.  His pacemaker was interrogated and is working appropriately. He is to follow-up with  the office after discharge for this.  He was on Coumadin and this was managed by pharmacy during his hospital stay. He is therapeutic at discharge despite not having Coumadin for 2 days previously. It is felt that the amiodarone is interacting with the Coumadin and he will require a lower dose at discharge. He will be discharged on 1 mg daily.  He was noted to have thrombocytopenia on admission. His platelets were low but stable during his hospital stay. He had no bleeding issues. No old labs are available for comparison.  His BNP was elevated on admission and he was felt to have some heart failure. He was initially given IV Lasix at 20 mg daily and was changed to oral Lasix at 40 mg daily. He will be on Lasix 40 mg daily at discharge.  His respiratory status did not improve greatly. A [facility] was reportedly without acute disease. A repeat chest x-ray here showed possible pneumonia so he was started on Levaquin. He completed antibiotics prior to discharge. A follow-up chest x-ray was clear.   His respiratory status remained poor pulmonary consult was called. He was started on oral steroids and Xopenex nebulizers. He is to do a steroid taper as an outpatient over 7 days and remain on the nebulizers at 4 times daily after discharge. He had been on home O2 prior to discharge at 3 L. His oxygen requirements remained high and pulmonary manage this. He will remain on oxygen by nasal cannula at discharge per their instructions.  His renal function and electrolytes were followed. At one point he was hyperkalemic  with a potassium of 5.8. This was treated with insulin, D50 and Kayexalate and improved. Discharge labs are below.  His renal function was followed closely during his hospital stay. On admission his BUN was 36 with creatinine of 1.46. This improved during his hospital stay and discharge labs are below.  He has a history of CAD but had no acute ischemic symptoms and no chest pain. No  additional ischemic workup was indicated.  He was noted to have orthostatic hypotension. His blood pressure tended to be stable but low. He was started on midodrine with some improvement. He is to continue the Midodrine as an outpatient.  He had some weakness and was felt to have deconditioning. He was seen by physical therapy and skilled nursing facility placement was recommended. The situation was discussed with the patient and family. They all agreed the placements the best option. He has accepted a bed at [facility] skilled nursing facility in [place].   Labs:   Lab Results  Component Value Date   WBC 6.5 08/13/2014   HGB 12.9* 08/13/2014   HCT 39.1 08/13/2014   MCV 111.7* 08/13/2014   PLT 93* 08/13/2014     Recent Labs Lab 08/14/14 0342  NA 146  K 4.1  CL 96  CO2 44*  BUN 39*  CREATININE 1.27  CALCIUM 8.9  GLUCOSE 153*   PRO B NATRIURETIC PEPTIDE (BNP)  Date/Time Value Ref Range Status  08/08/2014 03:18 AM 5097.0* 0 - 450 pg/mL Final  08/05/2014 11:26 PM 3935.0* 0 - 450 pg/mL Final    Recent Labs  08/14/14 0342  INR 2.65*      Radiology: Dg Chest 2 View 08/11/2014   CLINICAL DATA:  Shortness of breath  EXAM: CHEST  2 VIEW  COMPARISON:  08/06/2014  FINDINGS: Cardiac shadow remains enlarged. A pacing device is again seen and stable. Aortic calcifications are noted. Some interval clearing in the right lung base is noted. Persistent interstitial changes are seen. No focal confluent infiltrate is noted. A small pleural effusion is seen.  IMPRESSION: Interval improvement in right basilar changes. No new focal abnormality is seen.   Electronically Signed   By: Alcide Clever M.D.   On: 08/11/2014 11:09   Dg Chest Port 1 View 08/06/2014   CLINICAL DATA:  Cough  EXAM: PORTABLE CHEST - 1 VIEW  COMPARISON:  10/11/2010  FINDINGS: Cardiac enlargement with transvenous pacemaker. Negative for heart failure.a  Patchy airspace disease has developed in the right lower lobe which could  represent atelectasis or pneumonia. Negative for effusion. Left upper lobe emphysema.  IMPRESSION: Interval development of mild right lower lobe atelectasis or pneumonia.   Electronically Signed   By: Marlan Palau M.D.   On: 08/06/2014 09:31    EKG: 08/11/2014 Atrial fibrillation, occasional PVCs with an incomplete right bundle branch block  Echo: 08/07/2014 Study Conclusions - Left ventricle: LVEF is approximately 45 to 50% with inferior / inferoseptal hypokinesis. The cavity size was mildly dilated. Wall thickness was normal. Systolic function was mildly reduced. The estimated ejection fraction was in the range of 45% to 50%. - Left atrium: The atrium was severely dilated. - Right ventricle: The cavity size was moderately to severely dilated. Systolic function was severely reduced. - Right atrium: The atrium was severely dilated. - Tricuspid valve: There was moderate regurgitation. - Pulmonary arteries: PA peak pressure: 43 mm Hg (S). - Pericardium, extracardiac: A trivial pericardial effusion was identified.  FOLLOW UP PLANS AND APPOINTMENTS Allergies  Allergen Reactions  .  Sulfonamide Derivatives     REACTION: Swelling of jaws and had welps     Medication List    STOP taking these medications        fludrocortisone 0.1 MG tablet  Commonly known as:  FLORINEF     formoterol 12 MCG capsule for inhaler  Commonly known as:  FORADIL     metoprolol tartrate 25 MG tablet  Commonly known as:  LOPRESSOR     probenecid 500 MG tablet  Commonly known as:  BENEMID      TAKE these medications        acetaminophen 325 MG tablet  Commonly known as:  TYLENOL  Take 2 tablets (650 mg total) by mouth every 4 (four) hours as needed for headache or mild pain.     albuterol 108 (90 BASE) MCG/ACT inhaler  Commonly known as:  PROAIR HFA  Inhale 1-2 puffs into the lungs 3 (three) times daily as needed for wheezing. For shortness of breath     amiodarone 200 MG tablet    Commonly known as:  PACERONE  Take 1 tablet (200 mg total) by mouth daily.     aspirin EC 81 MG tablet  Take 1 tablet (81 mg total) by mouth daily.     atorvastatin 20 MG tablet  Commonly known as:  LIPITOR  Take 20 mg by mouth daily.     cetirizine 10 MG tablet  Commonly known as:  ZYRTEC  Take 10 mg by mouth daily as needed for allergies.     colchicine-probenecid 0.5-500 MG per tablet  Take 1 tablet by mouth daily.     digoxin 0.125 MG tablet  Commonly known as:  LANOXIN  Take 1 tablet (0.125 mg total) by mouth daily.     ferrous sulfate 325 (65 FE) MG EC tablet  Take 1 tablet (325 mg total) by mouth daily with breakfast.     finasteride 5 MG tablet  Commonly known as:  PROSCAR  Take 5 mg by mouth daily.     FLOMAX 0.4 MG Caps capsule  Generic drug:  tamsulosin  Take 0.4 mg by mouth daily.     furosemide 40 MG tablet  Commonly known as:  LASIX  Take 1 tablet (40 mg total) by mouth daily.     levalbuterol 0.63 MG/3ML nebulizer solution  Commonly known as:  XOPENEX  Take 3 mLs (0.63 mg total) by nebulization 4 (four) times daily.     lubiprostone 24 MCG capsule  Commonly known as:  AMITIZA  Take 24 mcg by mouth daily with breakfast.     midodrine 5 MG tablet  Commonly known as:  PROAMATINE  Take 1 tablet (5 mg total) by mouth 3 (three) times daily with meals.     nebivolol 5 MG tablet  Commonly known as:  BYSTOLIC  Take 1 tablet (5 mg total) by mouth daily.     potassium chloride 10 MEQ tablet  Commonly known as:  K-DUR  Take 10 mEq by mouth daily.     predniSONE 10 MG tablet  Commonly known as:  DELTASONE  Take 0.5-2 tablets (5-20 mg total) by mouth daily with breakfast. 2 tab x 2 d, 1-1/2 tab x 2 d, 1 tab x 2 d, 1/2 tab x 2 d, stop.     SYMBICORT 160-4.5 MCG/ACT inhaler  Generic drug:  budesonide-formoterol  Inhale 2 puffs into the lungs 2 (two) times daily.     warfarin 1 MG tablet  Commonly known as:  COUMADIN  Take 1 tablet (1 mg total) by  mouth daily. Or as directed. Coumadin check 11/16        Discharge Instructions    Diet - low sodium heart healthy    Complete by:  As directed      Increase activity slowly    Complete by:  As directed           Follow-up Information    Follow up with HAGER, BRYAN, PA-C On 09/04/2014.   Specialty:  Physician Assistant   Why:  See for Dr. Royann Shiversroitoru at 11:30 am   Contact information:   [address] [place] [address] [phone]       Follow up with PARRETT,TAMMY, NP On 08/25/2014.   Specialty:  Nurse Practitioner   Why:  11:30 AM  Pulmonary   Contact information:   [address] [place] [address] [phone]       BRING ALL MEDICATIONS WITH YOU TO FOLLOW UP APPOINTMENTS  Time spent with patient to include physician time: 63 min Signed: Theodore Demarkhonda Augustin Bun, PA-C 08/14/2014, 3:38 PM Co-Sign MD

## 2014-08-14 NOTE — Progress Notes (Signed)
ANTICOAGULATION CONSULT NOTE - Follow Up Consult  Pharmacy Consult for Warfarin Indication: atrial fibrillation  Allergies  Allergen Reactions  . Sulfonamide Derivatives     REACTION: Swelling of jaws and had welps    Patient Measurements: Height: 5\' 8"  (172.7 cm) Weight: 159 lb 9.8 oz (72.4 kg) IBW/kg (Calculated) : 68.4  Vital Signs: Temp: 98.3 F (36.8 C) (11/12 0728) Temp Source: Oral (11/12 0728) BP: 96/62 mmHg (11/12 0800) Pulse Rate: 109 (11/12 0800)  Labs:  Recent Labs  08/12/14 0354 08/13/14 0100 08/13/14 0321 08/14/14 0342  HGB  --  12.9*  --   --   HCT  --  39.1  --   --   PLT  --  93*  --   --   LABPROT 31.1*  --  30.9* 28.5*  INR 2.97*  --  2.94* 2.65*  CREATININE 1.17  --  1.13 1.27    Estimated Creatinine Clearance: 41.9 mL/min (by C-G formula based on Cr of 1.27).   Assessment: [age] YOM who continues on warfarin for hx Afib with a therapeutic INR this morning (INR 2.65 << 2.94). Doses were held on 11/10 and 11/11 after the INR started to rise quickly due to increased sensitivity with start of amiodarone. Will restart warfarin this evening at a reduced dose - will likely need a reduced dose upon discharge as well. No CBC today - no overt s/sx of bleeding noted. The patient was re-educated on warfarin this admission.   Goal of Therapy:  INR 2-3   Plan:  1. Warfarin 1 mg x 1 dose at 1800 today 2. Will continue to monitor for any signs/symptoms of bleeding and will follow up with PT/INR in the a.m.   Georgina PillionElizabeth Mihailo Sage, PharmD, BCPS Clinical Pharmacist Pager: [phone] 08/14/2014 10:08 AM

## 2014-08-14 NOTE — Progress Notes (Deleted)
PULMONARY / CRITICAL CARE MEDICINE   Name: Willie [name] MRN: [id number] DOB: 1927-11-08    ADMISSION DATE:  08/05/2014 CONSULTATION DATE:  11/10  REFERRING MD :  Cards  CHIEF COMPLAINT:  Weak  INITIAL PRESENTATION: Mr. Willie [name] is a 80 yo ,life long smoker, chewing tobacco user, O2 and steroid dependent COPD who was admitted with bradycardia from pacemaker failure. He is followed by Dr. Maple HudsonYoung of the pulmonary office. Note last seen in 2013 by Dr. Maple HudsonYoung. He has an extensive past medical history as noted below. He notes some shaking but no fever , sweats or purulent sputum. He denies chest pain. He is SOB but no worse than usual. PCCM asked to evaluate for hypercarbia with metabolic compensation.  STUDIES:    SIGNIFICANT EVENTS:   SUBJECTIVE: Ambulated on 2L yesterday and remained above 95%, unable to describe if he feels better or not.    VITAL SIGNS: Temp:  [98 F (36.7 C)-98.3 F (36.8 C)] 98.3 F (36.8 C) (11/12 0728) Pulse Rate:  [67-115] 109 (11/12 0800) Resp:  [11-27] 22 (11/12 0800) BP: (81-127)/(40-90) 96/62 mmHg (11/12 0800) SpO2:  [94 %-100 %] 98 % (11/12 0800) Weight:  [72.4 kg (159 lb 9.8 oz)] 72.4 kg (159 lb 9.8 oz) (11/12 0455) HEMODYNAMICS:   VENTILATOR SETTINGS:   INTAKE / OUTPUT:  Intake/Output Summary (Last 24 hours) at 08/14/14 1007 Last data filed at 08/13/14 1925  Gross per 24 hour  Intake    440 ml  Output    400 ml  Net     40 ml    PHYSICAL EXAMINATION: General: Frail kyphoscolotic white male, no purse lip breathing today Neuro:  Alert, oriented, non-focal  HEENT:  Poor dentition. No JVD/LAN Cardiovascular:  Tachy, irreg irreg Lungs:  Mild rhonchi. Faint wheezes Abdomen:  +bs Musculoskeletal:  Intact Skin:  Lower ext chronic stasis changes  LABS:  CBC  Recent Labs Lab 08/08/14 0318 08/13/14 0100  WBC 8.0 6.5  HGB 14.6 12.9*  HCT 45.1 39.1  PLT 82* 93*   Coag's  Recent Labs Lab 08/12/14 0354 08/13/14 0321  08/14/14 0342  INR 2.97* 2.94* 2.65*   BMET  Recent Labs Lab 08/12/14 0354 08/13/14 0321 08/14/14 0342  NA 145 147 146  K 4.3 4.0 4.1  CL 100 97 96  CO2 40* 44* 44*  BUN 33* 33* 39*  CREATININE 1.17 1.13 1.27  GLUCOSE 123* 129* 153*   Electrolytes  Recent Labs Lab 08/12/14 0354 08/13/14 0321 08/14/14 0342  CALCIUM 8.4 8.7 8.9   Sepsis Markers No results for input(s): LATICACIDVEN, PROCALCITON, O2SATVEN in the last 168 hours. ABG  Recent Labs Lab 08/12/14 1200  PHART 7.362  PCO2ART 73.9*  PO2ART 89.1   Liver Enzymes No results for input(s): AST, ALT, ALKPHOS, BILITOT, ALBUMIN in the last 168 hours. Cardiac Enzymes  Recent Labs Lab 08/08/14 0318  PROBNP 5097.0*   Glucose  Recent Labs Lab 08/12/14 2202 08/13/14 0735 08/13/14 1148 08/13/14 1601 08/13/14 2143 08/14/14 0725  GLUCAP 305* 104* 240* 187* 158* 191*    Imaging No results found.   ASSESSMENT / PLAN:  PULMONARY  A: Acute on chronic compensated hypercarbic resp failure in the setting of continued tobacco abuse with cigarettes and chewing tobacco. At time of consult was chewing tobacco. Severe Kyphoscoliosis.  pulm edema component from Fib P:  O2 at 2L, titrate to maintain SpO2 88-92% Clear for discharge to SNF from pulmonary standpoint Lasix per cards Continue scheduled Xopenex nebs at SNF Continued Symbicort  Prednisone slow taper down to home dose of 5mg  daily Tobacco cessation counseling  CARDIOVASCULAR CVL A:  CAF EF 45% PPM CHF CAD post LAD stent  P:  Per cards Rate control  RENAL A:   CRI P:   Lab Results  Component Value Date   CREATININE 1.27 08/14/2014   CREATININE 1.13 08/13/2014   CREATININE 1.17 08/12/2014    Follow creatine Consider further neg balance per cards   HEMATOLOGIC A:  Chronic anticoagulation for Afib P:  Lab Results  Component Value Date   INR 2.65* 08/14/2014   INR 2.94* 08/13/2014   INR 2.97* 08/12/2014  follow  INR   ENDOCRINE A:   Chronic steroids  P:   Monitor glucose, especially as with increased steroids  NEUROLOGIC A:   Awake and alert Rass 1 P:   RASS goal: 1 Avoid sedating meds, particularly benzos    TODAY'S SUMMARY:  Appears as though increased diuresis and steroids have been beneficial for him. He is ok for discharge from a pulmonary standpoint. He tolerated ambulatory desatuartion assessment on 2L [place] while maintaining an O2 sat > 95%. Appears much less dyspneic than at time of admission. Would taper prednisonse back to home dose of 5mg , continue Xopenex nebs and Symbicort.   Joneen RoachPaul Jamontae Thwaites, ACNP [facility] Pager [phone] or [phone]

## 2014-08-14 NOTE — Progress Notes (Signed)
Pt tried to stand up and said he felt dizzy and weak he was shaking and requested to sit down, BP was checked and dropped from 102/45 to 96/68 CBG was checked it was 191, ambulated  about 50 feet on 4L [place] desats to low 85% he refused to walk further,noted increased WOB and dyspnea.

## 2014-08-15 DIAGNOSIS — D696 Thrombocytopenia, unspecified: Secondary | ICD-10-CM | POA: Diagnosis present

## 2014-08-15 LAB — GLUCOSE, CAPILLARY
GLUCOSE-CAPILLARY: 109 mg/dL — AB (ref 70–99)
GLUCOSE-CAPILLARY: 139 mg/dL — AB (ref 70–99)

## 2014-08-15 LAB — PROTIME-INR
INR: 2.24 — ABNORMAL HIGH (ref 0.00–1.49)
Prothrombin Time: 24.9 seconds — ABNORMAL HIGH (ref 11.6–15.2)

## 2014-08-15 MED ORDER — NEBIVOLOL HCL 2.5 MG PO TABS
2.5000 mg | ORAL_TABLET | Freq: Every day | ORAL | Status: DC
  Administered 2014-08-15: 2.5 mg via ORAL
  Filled 2014-08-15: qty 1

## 2014-08-15 MED ORDER — SALINE SPRAY 0.65 % NA SOLN: 1.0000 | NASAL | Status: AC | PRN

## 2014-08-15 MED ORDER — PREDNISONE 5 MG PO TABS: 5.0000 mg | ORAL_TABLET | Freq: Every day | ORAL | Status: AC

## 2014-08-15 MED ORDER — NEBIVOLOL HCL 2.5 MG PO TABS: 2.5000 mg | ORAL_TABLET | Freq: Every day | ORAL | Status: AC

## 2014-08-15 MED ORDER — WARFARIN SODIUM 2 MG PO TABS
2.0000 mg | ORAL_TABLET | Freq: Once | ORAL | Status: DC
  Filled 2014-08-15: qty 1

## 2014-08-15 MED ORDER — LUBIPROSTONE 24 MCG PO CAPS
24.0000 ug | ORAL_CAPSULE | Freq: Every day | ORAL | Status: DC
  Administered 2014-08-15: 24 ug via ORAL
  Filled 2014-08-15 (×2): qty 1

## 2014-08-15 MED ORDER — WARFARIN SODIUM 1 MG PO TABS
1.0000 mg | ORAL_TABLET | Freq: Once | ORAL | Status: AC
  Administered 2014-08-15: 1 mg via ORAL
  Filled 2014-08-15: qty 1

## 2014-08-15 NOTE — Progress Notes (Signed)
Systolic b/p 92, per Dr.  Richardine ServiceMalcahany ok to give am meds.

## 2014-08-15 NOTE — Progress Notes (Signed)
Brief Note: Pulm  Pt may be d/c to SNF with O2 at 2l/m to keep sats >90%  Continue with prednisone taper down to 5mg  daily (can taper over next week-(on home pred 5mg  daily) .  Has post hospital follow up at pulmonary clinic [facility] 08[facility]25/14 [facility] Spoke with pt nurse -Eber Jonesarolyn .   Tammy Parrett NP-C  [facility]  [phone]     Billy Fischeravid Camilo Mander, MD ; [facility] service Mobile [phone].  After 5:30 PM or weekends, call [phone]

## 2014-08-15 NOTE — Progress Notes (Addendum)
ANTICOAGULATION CONSULT NOTE - Follow Up Consult  Pharmacy Consult for Coumadin Indication: atrial fibrillation  Allergies  Allergen Reactions  . Sulfonamide Derivatives     REACTION: Swelling of jaws and had welps    Patient Measurements: Height: 5\' 8"  (172.7 cm) Weight: 163 lb 2.3 oz (74 kg) IBW/kg (Calculated) : 68.4 Heparin Dosing Weight:   Vital Signs: Temp: 98 F (36.7 C) (11/13 0756) Temp Source: Oral (11/13 0756) BP: 79/40 mmHg (11/13 0439) Pulse Rate: 94 (11/13 0439)  Labs:  Recent Labs  08/13/14 0100 08/13/14 0321 08/14/14 0342 08/15/14 0250  HGB 12.9*  --   --   --   HCT 39.1  --   --   --   PLT 93*  --   --   --   LABPROT  --  30.9* 28.5* 24.9*  INR  --  2.94* 2.65* 2.24*  CREATININE  --  1.13 1.27  --     Estimated Creatinine Clearance: 41.9 mL/min (by C-G formula based on Cr of 1.27).  Assessment: 86yom continues on Coumadin for Afib. INR (2.24) remains therapeutic. Doses were held on 11/10 and 11/11 after the INR started to rise quickly due to increased sensitivity with start of amiodarone - patient will likely need a reduced dose of Coumadin upon discharge. - No CBC today - No significant bleeding reported  Goal of Therapy:  INR 2-3   Plan:  1. Coumadin 2mg  PO x 1 today 2. Follow-up AM INR if not discharged 3. Patient will likely need reduced regimen on discharge due to amiodarone interaction - suggest changing regimen to 1mg  daily except 2mg  on MWF 4. Consider rechecking digoxin level as dose was increased with low level (0.4) but Amiodarone was also added which can increase digoxin levels  Cleon DewDulaney, [place]  [phone] 08/15/2014,10:15 AM  Addendum: Sherron MondaySpoke with Cards PA Barrett regarding patient's dosing. She recommended to be conservative with patient's frailty and thrombocytopenia (ok with lower end of goal INR range) - will adjust today's dose to 1mg  and agree with 1mg  daily upon discharge with follow-up early next week.  Wilfred LacyWesley  Brylee Mcgreal, PharmD Clinical Pharmacist [phone] 08/15/2014, 10:58 AM

## 2014-08-15 NOTE — Progress Notes (Signed)
Patient Name: Willie [name] Date of Encounter: 08/15/2014  Principal Problem:   Atrial fibrillation with RVR Active Problems:   CAD S/P LAD stent 2000   Permanent atrial fibrillation   COPD mixed type   Chronic anticoagulation with Coumadin   Pacemaker - St. Jude Accent DR RF 2010 (initial pacemaker implant 1991), programmed VVIR   Orthostatic hypotension   AAA (abdominal aortic aneurysm)   CAP (community acquired pneumonia)   Chronic renal insufficiency, stage III (moderate)   Cardiomyopathy, ischemic- EF 45-50% Nov 2015   Acute on chronic combined systolic and diastolic congestive heart failure   Hyperkalemia   Thrombocytopenia   Primary Cardiologist: Dr. Royann Shiversroitoru  Patient Profile: 80 y.o. male with a history of CAD, bradycardia requiring a St. Jude PPM and severe COPD on home O2, [facility] with SOB, possible bradycardia (?PPM failure). PPM is OK, pt rx for CHF, atrial fib RVR, PNA and COPD while here.  SUBJECTIVE: Feels SOB now, has not had am neb yet. (RT here now). No chest pain or palpitations.  OBJECTIVE Filed Vitals:   08/15/14 0114 08/15/14 0439 08/15/14 0756 08/15/14 0823  BP: 81/35 79/40    Pulse: 76 94    Temp:  98.3 F (36.8 C) 98 F (36.7 C)   TempSrc:  Axillary Oral   Resp: 25 29    Height:      Weight:  163 lb 2.3 oz (74 kg)    SpO2: 98% 97%  94%    Intake/Output Summary (Last 24 hours) at 08/15/14 0843 Last data filed at 08/15/14 0439  Gross per 24 hour  Intake    120 ml  Output   1150 ml  Net  -1030 ml   Filed Weights   08/13/14 0500 08/14/14 0455 08/15/14 0439  Weight: 165 lb 2 oz (74.9 kg) 159 lb 9.8 oz (72.4 kg) 163 lb 2.3 oz (74 kg)    PHYSICAL EXAM General: Well developed, well nourished, male in no acute distress. Head: Normocephalic, atraumatic.  Neck: Supple without bruits, JVD 8 cm. Lungs:  Resp regular and unlabored, decreased BS bilateral bases. Heart: Irreg irreg, S1, S2, no S3, S4, or murmur; no  rub. Abdomen: Soft, non-tender, non-distended, BS + x 4.  Extremities: No clubbing, cyanosis, no edema.  Neuro: Alert and oriented X 3. Moves all extremities spontaneously. Psych: Normal affect.  LABS: CBC:  Recent Labs  08/13/14 0100  WBC 6.5  HGB 12.9*  HCT 39.1  MCV 111.7*  PLT 93*   INR:  Recent Labs  08/15/14 0250  INR 2.24*   Basic Metabolic Panel:  Recent Labs  16/07/9610/11/15 0321 08/14/14 0342  NA 147 146  K 4.0 4.1  CL 97 96  CO2 44* 44*  GLUCOSE 129* 153*  BUN 33* 39*  CREATININE 1.13 1.27  CALCIUM 8.7 8.9   BNP: PRO B NATRIURETIC PEPTIDE (BNP)  Date/Time Value Ref Range Status  08/08/2014 03:18 AM 5097.0* 0 - 450 pg/mL Final  08/05/2014 11:26 PM 3935.0* 0 - 450 pg/mL Final    TELE:  Atrial fib  Radiology/Studies: Dg Chest 2 View 08/11/2014   CLINICAL DATA:  Shortness of breath  EXAM: CHEST  2 VIEW  COMPARISON:  08/06/2014  FINDINGS: Cardiac shadow remains enlarged. A pacing device is again seen and stable. Aortic calcifications are noted. Some interval clearing in the right lung base is noted. Persistent interstitial changes are seen. No focal confluent infiltrate is noted. A small pleural effusion is seen.  IMPRESSION: Interval improvement in right basilar changes. No new focal abnormality is seen.   Electronically Signed   By: Alcide CleverMark  Lukens M.D.   On: 08/11/2014 11:09   Dg Chest Port 1 View 08/06/2014   CLINICAL DATA:  Cough  EXAM: PORTABLE CHEST - 1 VIEW  COMPARISON:  10/11/2010  FINDINGS: Cardiac enlargement with transvenous pacemaker. Negative for heart failure.a  Patchy airspace disease has developed in the right lower lobe which could represent atelectasis or pneumonia. Negative for effusion. Left upper lobe emphysema.  IMPRESSION: Interval development of mild right lower lobe atelectasis or pneumonia.   Electronically Signed   By: Marlan Palauharles  Clark M.D.   On: 08/06/2014 09:31   Current Medications:  . amiodarone  200 mg Oral Daily  . antiseptic oral  rinse  7 mL Mouth Rinse BID  . aspirin EC  81 mg Oral Daily  . atorvastatin  20 mg Oral Daily  . budesonide-formoterol  2 puff Inhalation BID  . chlorhexidine  15 mL Mouth Rinse BID  . digoxin  0.125 mg Oral Daily  . furosemide  40 mg Oral BID  . levalbuterol  0.63 mg Nebulization Q6H  . midodrine  5 mg Oral TID WC  . nebivolol  5 mg Oral Daily  . predniSONE  20 mg Oral BID WC  . sodium chloride  3 mL Intravenous Q12H  . tamsulosin  0.4 mg Oral QPC supper  . Warfarin - Pharmacist Dosing Inpatient   Does not apply q1800    ASSESSMENT AND PLAN: Principal Problem:   Atrial fibrillation with RVR - Nevibolol held yest due to SBP 84. Will decrease dose and follow. Not sure he will receive it at facility unless BP can run a little higher.  Continue amio, dig  Active Problems:   CAD S/P LAD stent 2000 - no ischemic sx    Permanent atrial fibrillation - see above    COPD mixed type - mgt per pulm, started OP steroid taper by going to 20 mg daily, CCM advise.    Chronic anticoagulation with Coumadin - check next week    Pacemaker - St. Jude Accent DR RF 2010 (initial pacemaker implant 1991), programmed VVIR - see above    Orthostatic hypotension - on midodrine, but BP still runs low chronically, think we have to tolerate SBP 80s     AAA (abdominal aortic aneurysm) - follow    CAP (community acquired pneumonia) - abx completed    Chronic renal insufficiency, stage III (moderate) - follow, decrease Lasix to 40 mg qd, d/c dose.    Cardiomyopathy, ischemic- EF 45-50% Nov 2015 - see below    Acute on chronic combined systolic and diastolic congestive heart failure - BP will not tolerate ACD/ARB. BB and Lasix are primary rx for this.    Hyperkalemia - improved, d/c OP supplement    Thrombocytopenia - Plt low on admission, no bleeding issues, would keep coumadin on low side of therapeutic.  See D/C summary. Pt held overnight because O2 sats decreased with ambulation (poor exercise  tolerance at baseline).   Medication questions re: home meds he is not on here:  Amitiza (constipation), Proscar, colchicine/probenecid, iron 325 mg, albuterol MDI.  Please advise which, if any, he should continue. (other meds were more obvious and d/c'd).  D/C to SNF when medically stable, need EOL discussion at some point with MD he knows well and will trust.  Signed, Theodore Demarkhonda Barrett , PA-C 8:43 AM 08/15/2014   I have personally seen and  examined this patient with Theodore Demark, PA-C. I agree with the assessment and plan as outlined above. D/c nebivolol. Would restart colchicine/probenecid, iron. Will need to check with pharmacy if any contraindication to Amitiza with his other meds. Will not restart Proscar. Outpatient pulmonary regimen to be defined by Pulmonary team. He is stable from a cardiac standpoint and ready for SNF. Will not likely improve his BP or HR. Plans for discharge pending pulmonary evaluation.    MCALHANY,CHRISTOPHER 08/15/2014 10:13 AM

## 2014-08-15 NOTE — Discharge Instructions (Signed)
INR ON MONDAY, 11/16 BMET on 11/16 Digoxin level on 11/16  Daily weights

## 2014-08-15 NOTE — Discharge Summary (Signed)
Addendum:  Patient had problems with desaturation secondary to ambulation. He was held overnight and the Pulmonary team reassessed him.  On 11/13, he was seen by Dr. Clifton JamesMcAlhany and [facility].  Final medication adjustments were made. He is to remain on oxygen at 2 L/m continuous and increase as as needed to maintain oxygen saturation greater than 89%.  He has a follow-up appointment with the Pulmonary nurse practitioner.  Lab work has been ordered from Monday, 11/16, including a BMET, digoxin level, and INR.  He is to continue to get daily weights.    Medication List    STOP taking these medications        finasteride 5 MG tablet  Commonly known as:  PROSCAR     fludrocortisone 0.1 MG tablet  Commonly known as:  FLORINEF     formoterol 12 MCG capsule for inhaler  Commonly known as:  FORADIL     metoprolol tartrate 25 MG tablet  Commonly known as:  LOPRESSOR     potassium chloride 10 MEQ tablet  Commonly known as:  K-DUR     probenecid 500 MG tablet  Commonly known as:  BENEMID      TAKE these medications        acetaminophen 325 MG tablet  Commonly known as:  TYLENOL  Take 2 tablets (650 mg total) by mouth every 4 (four) hours as needed for headache or mild pain.     albuterol 108 (90 BASE) MCG/ACT inhaler  Commonly known as:  PROAIR HFA  Inhale 1-2 puffs into the lungs 3 (three) times daily as needed for wheezing. For shortness of breath     amiodarone 200 MG tablet  Commonly known as:  PACERONE  Take 1 tablet (200 mg total) by mouth daily.     aspirin EC 81 MG tablet  Take 1 tablet (81 mg total) by mouth daily.     atorvastatin 20 MG tablet  Commonly known as:  LIPITOR  Take 20 mg by mouth daily.     cetirizine 10 MG tablet  Commonly known as:  ZYRTEC  Take 10 mg by mouth daily as needed for allergies.     colchicine-probenecid 0.5-500 MG per tablet  Take 1 tablet by mouth daily.     digoxin 0.125 MG tablet  Commonly known as:  LANOXIN  Take 1 tablet  (0.125 mg total) by mouth daily.     ferrous sulfate 325 (65 FE) MG EC tablet  Take 1 tablet (325 mg total) by mouth daily with breakfast.     FLOMAX 0.4 MG Caps capsule  Generic drug:  tamsulosin  Take 0.4 mg by mouth daily.     furosemide 40 MG tablet  Commonly known as:  LASIX  Take 1 tablet (40 mg total) by mouth daily.     levalbuterol 0.63 MG/3ML nebulizer solution  Commonly known as:  XOPENEX  Take 3 mLs (0.63 mg total) by nebulization 4 (four) times daily.     lubiprostone 24 MCG capsule  Commonly known as:  AMITIZA  Take 24 mcg by mouth daily with breakfast.     midodrine 5 MG tablet  Commonly known as:  PROAMATINE  Take 1 tablet (5 mg total) by mouth 3 (three) times daily with meals.     nebivolol 2.5 MG tablet  Commonly known as:  BYSTOLIC  Take 1 tablet (2.5 mg total) by mouth daily.     predniSONE 5 MG tablet  Commonly known as:  DELTASONE  Take 1-4  tablets (5-20 mg total) by mouth daily with breakfast. 4 tab x 2 d, 3 tab x 2 d, 2 tab x 2 d, stay on 1 tab daily.     sodium chloride 0.65 % Soln nasal spray  Commonly known as:  OCEAN  Place 1 spray into both nostrils as needed for congestion.     SYMBICORT 160-4.5 MCG/ACT inhaler  Generic drug:  budesonide-formoterol  Inhale 2 puffs into the lungs 2 (two) times daily.     warfarin 1 MG tablet  Commonly known as:  COUMADIN  Take 1 tablet (1 mg total) by mouth daily. Or as directed. Coumadin check 11/16        Theodore DemarkRhonda Barrett, PA-C 08/15/2014 3:29 PM

## 2014-08-15 NOTE — Clinical Social Work Note (Signed)
CSW received call from patients son inquiring about patients DC plan.  CSW spoke with RN who stated that patient needs to be cleared by pulmonology before he can go- RN to facilitate.   CSW informed patients son of plan and will continue to update.  Merlyn LotJenna Holoman, LCSWA Clinical Social Worker [phone]

## 2014-08-15 NOTE — Clinical Social Work Note (Signed)
Patient will discharge [facility] Anticipated discharge date:11/13 Family notified:Teddy- son Transportation by family Remigio Eisenmenger(Teddy)  CSW signing off.  Merlyn LotJenna Holoman, LCSWA Clinical Social Worker [phone]

## 2014-08-22 ENCOUNTER — Encounter: Payer: Self-pay | Admitting: Cardiology

## 2014-08-25 ENCOUNTER — Inpatient Hospital Stay: Payer: Medicare Other | Admitting: Adult Health

## 2014-08-26 ENCOUNTER — Telehealth: Payer: Self-pay | Admitting: Cardiovascular Disease

## 2014-08-26 NOTE — Telephone Encounter (Signed)
Left message on answering machine - Britta MccreedyBarbara out of office this afternoon Will route to Dr Royann Shiversroitoru and Britta MccreedyBarbara

## 2014-08-26 NOTE — Telephone Encounter (Signed)
Eddie called in stating that Mr.Bratz passed on 11/20 and would like for Britta MccreedyBarbara to give him a call.  Thanks

## 2014-08-27 ENCOUNTER — Encounter: Payer: Self-pay | Admitting: Cardiovascular Disease

## 2014-09-01 ENCOUNTER — Telehealth: Payer: Self-pay | Admitting: Cardiology

## 2014-09-01 NOTE — Telephone Encounter (Signed)
Spoke with pt son and he informed him me that pt has passed away 2 weeks ago. Pt son wanted office manager direct number to speak with her about pt MD. Jovita GammaGave him Probation officerclinical manager director number.

## 2014-09-02 DEATH — deceased

## 2014-09-04 ENCOUNTER — Ambulatory Visit: Payer: Medicare Other | Admitting: Physician Assistant

## 2014-09-11 ENCOUNTER — Telehealth: Payer: Self-pay

## 2014-09-11 NOTE — Telephone Encounter (Signed)
Patient died per Obituary °

## 2014-09-30 NOTE — Telephone Encounter (Signed)
Patient has been mark in all systems as being deceased as of March 18, 2014.  cbr

## 2016-05-29 IMAGING — CR DG CHEST 1V PORT
1 series · 1 of 1 positions shown · non-contrast
Comparison: 10/11/2010

CLINICAL DATA: Cough

EXAM:
PORTABLE CHEST - 1 VIEW

[AP]
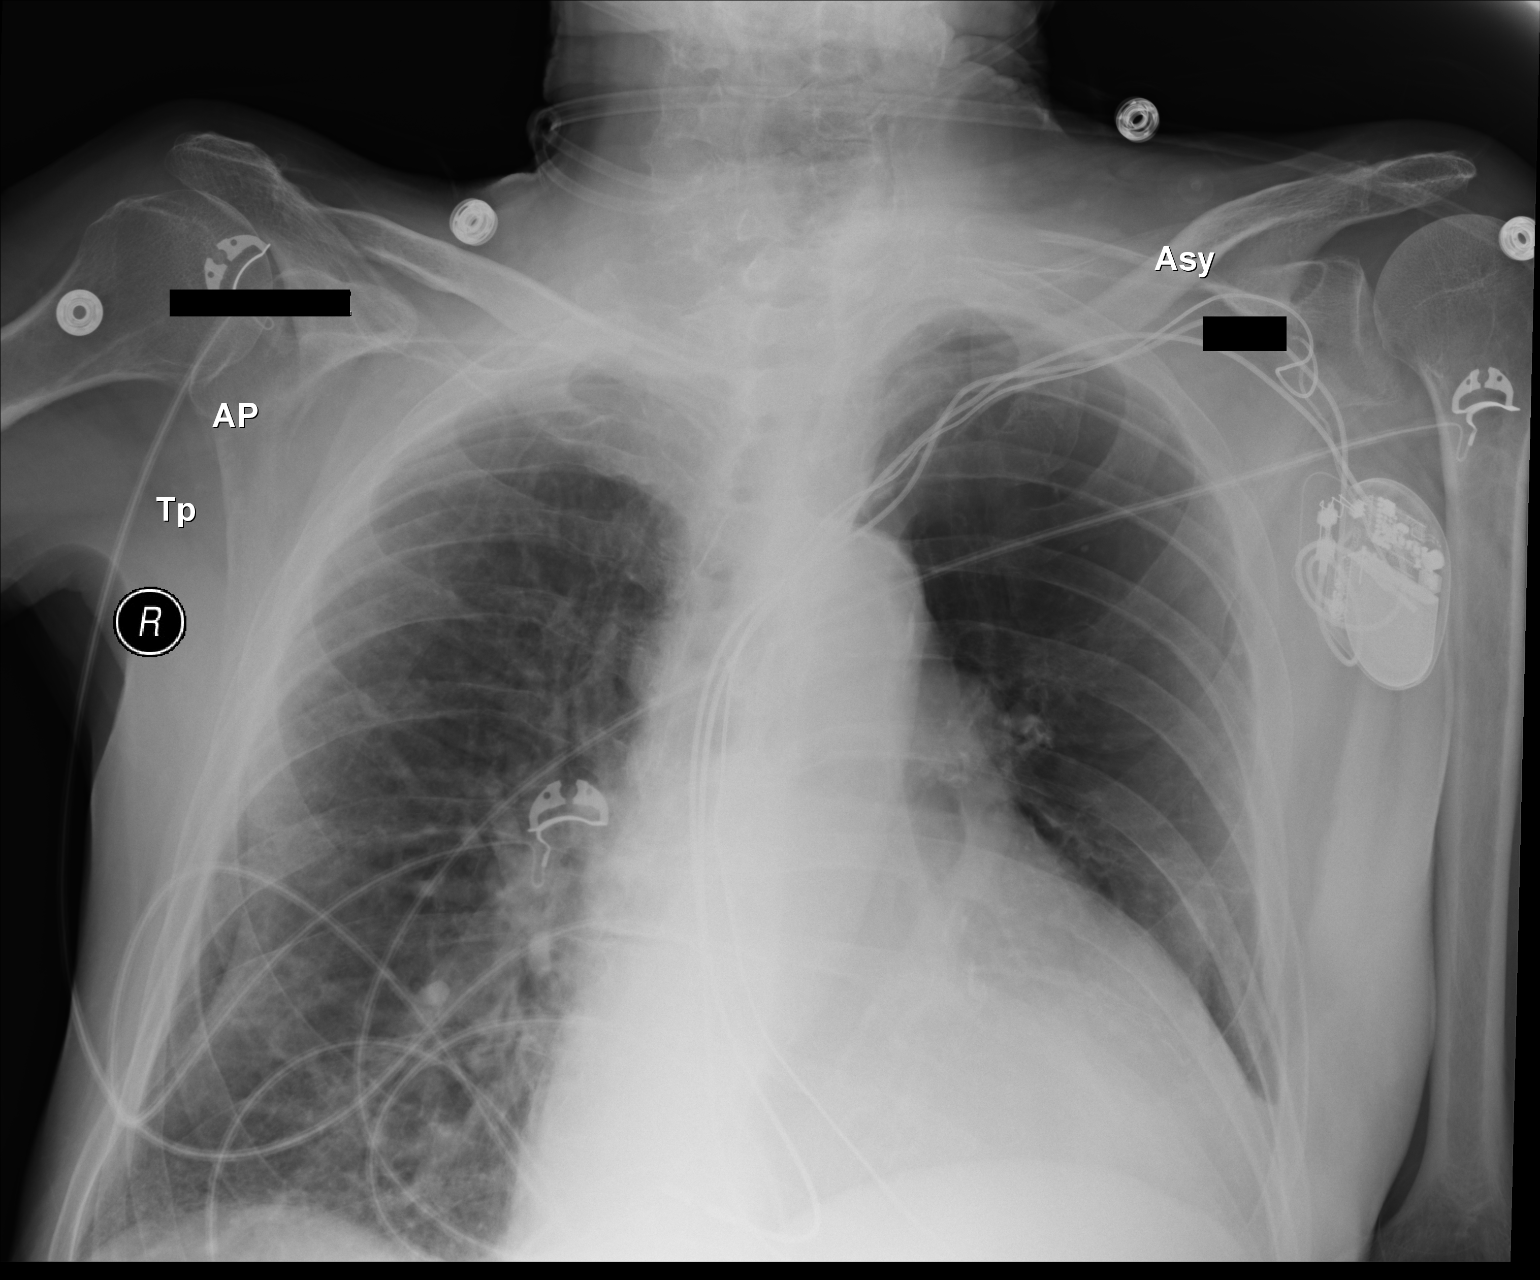

[1 of 1 positions shown; findings below may reference images not displayed]

FINDINGS: Cardiac enlargement with transvenous pacemaker. Negative for heart
failure.a

Patchy airspace disease has developed in the right lower lobe which
could represent atelectasis or pneumonia. Negative for effusion.
Left upper lobe emphysema.
IMPRESSION: Interval development of mild right lower lobe atelectasis or
pneumonia.
